# Patient Record
Sex: Female | Born: 2000 | Hispanic: No | Marital: Single | Smoking: Never smoker
Health system: Southern US, Community
[De-identification: ages and names within clinical notes are randomized; demographics above are authoritative.]

## PROBLEM LIST (undated history)

## (undated) DIAGNOSIS — J189 Pneumonia, unspecified organism: Secondary | ICD-10-CM

## (undated) DIAGNOSIS — J302 Other seasonal allergic rhinitis: Secondary | ICD-10-CM

## (undated) HISTORY — DX: Pneumonia, unspecified organism: J18.9

---
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
## 2021-01-07 DIAGNOSIS — F4320 Adjustment disorder, unspecified: Secondary | ICD-10-CM | POA: Diagnosis not present

## 2021-01-10 DIAGNOSIS — M25511 Pain in right shoulder: Secondary | ICD-10-CM | POA: Diagnosis not present

## 2021-01-17 DIAGNOSIS — S43401A Unspecified sprain of right shoulder joint, initial encounter: Secondary | ICD-10-CM | POA: Diagnosis not present

## 2021-01-18 DIAGNOSIS — M25511 Pain in right shoulder: Secondary | ICD-10-CM | POA: Diagnosis not present

## 2021-01-21 DIAGNOSIS — F4320 Adjustment disorder, unspecified: Secondary | ICD-10-CM | POA: Diagnosis not present

## 2021-01-24 DIAGNOSIS — M25511 Pain in right shoulder: Secondary | ICD-10-CM | POA: Diagnosis not present

## 2021-01-28 IMAGING — DX DG FOOT COMPLETE 3+V*R*
3 series · 3 of 3 positions shown · non-contrast
Comparison: None.

CLINICAL DATA: Dorsal foot pain

EXAM:
RIGHT FOOT COMPLETE - 3+ VIEW

[foot ap]
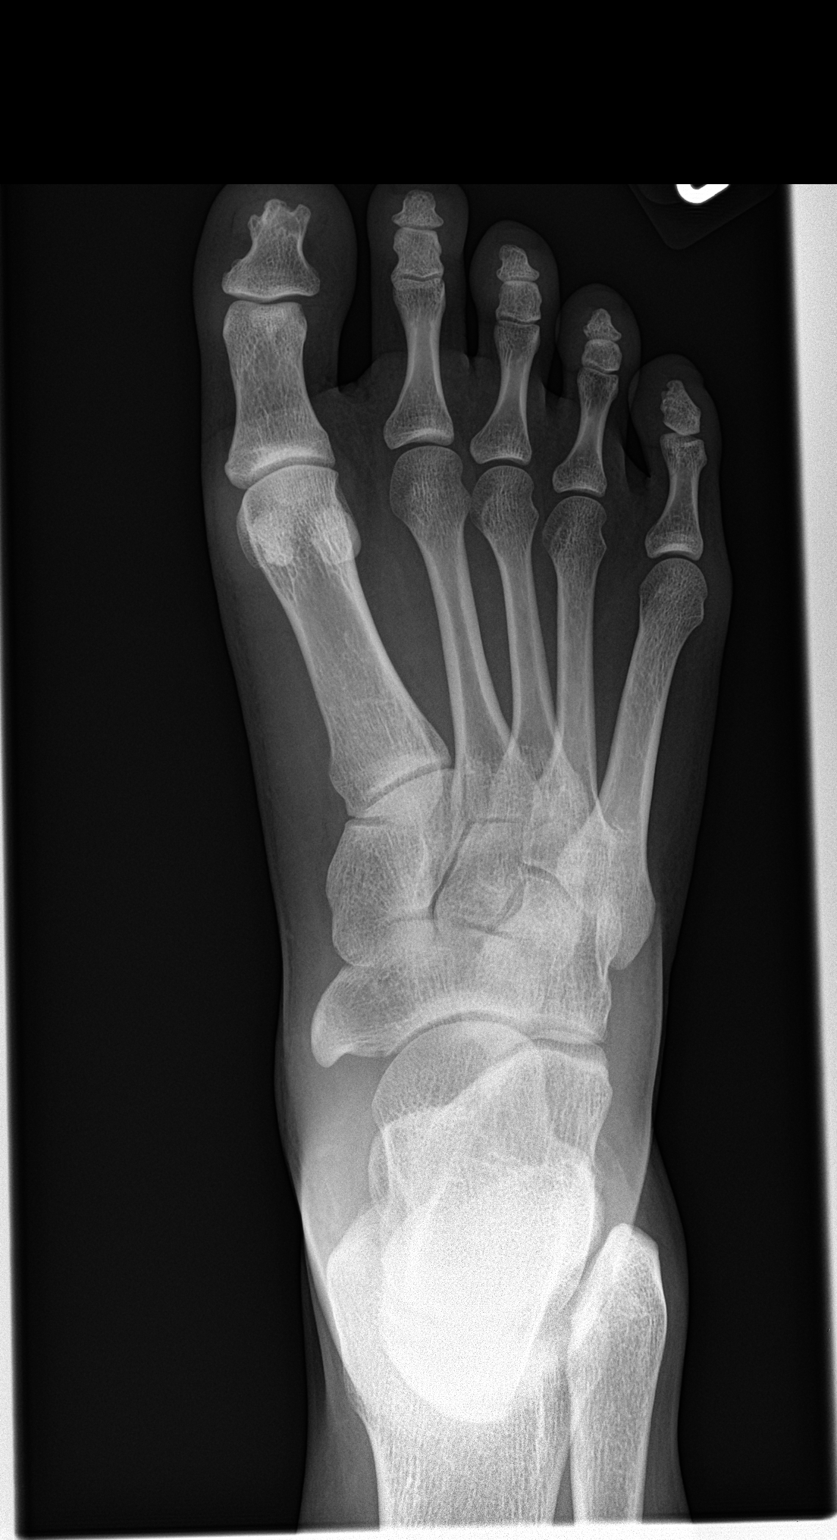

[foot obl]
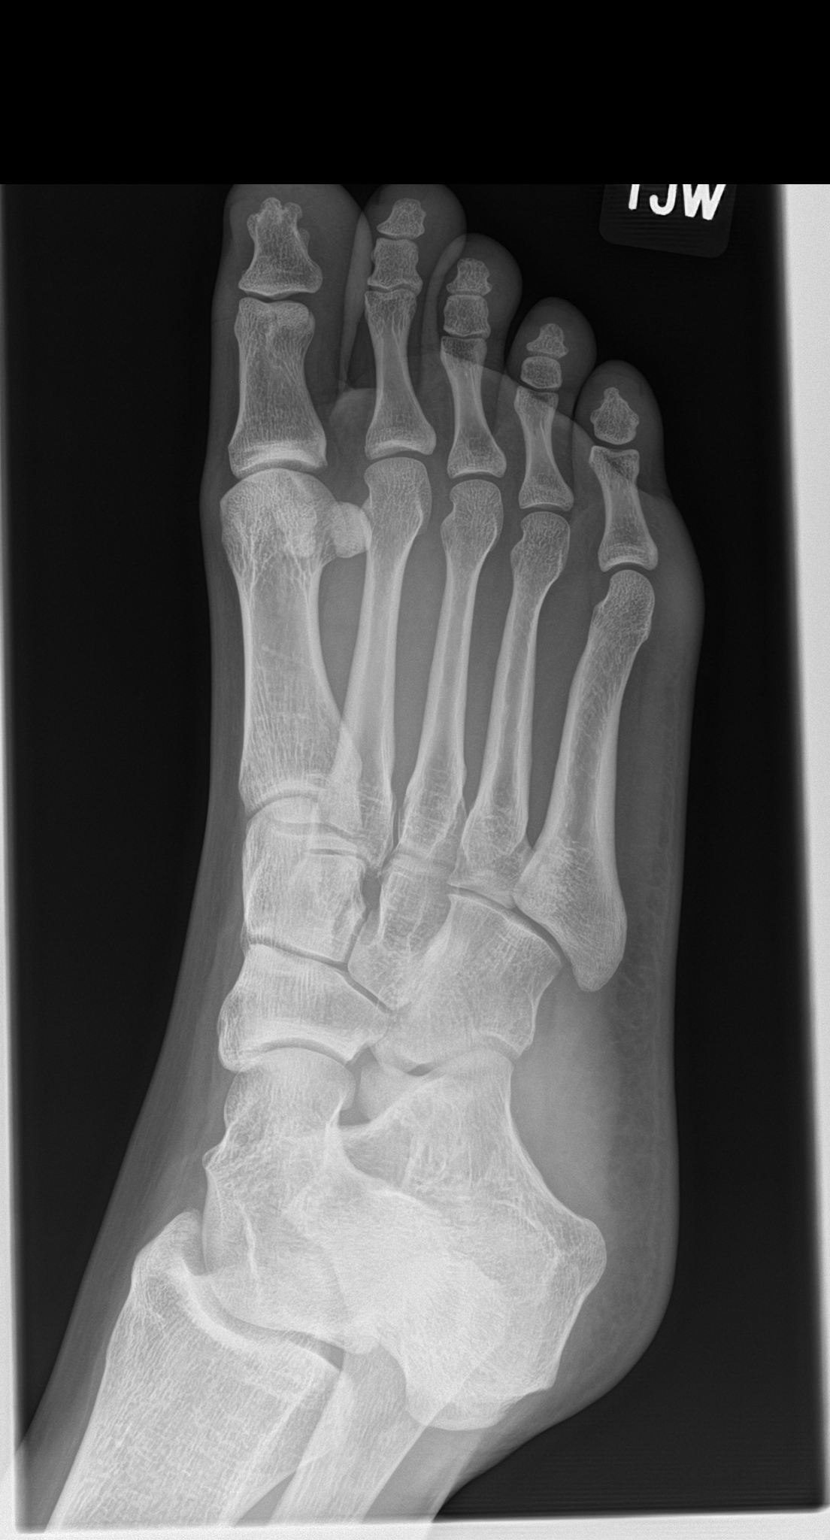

[foot lat]
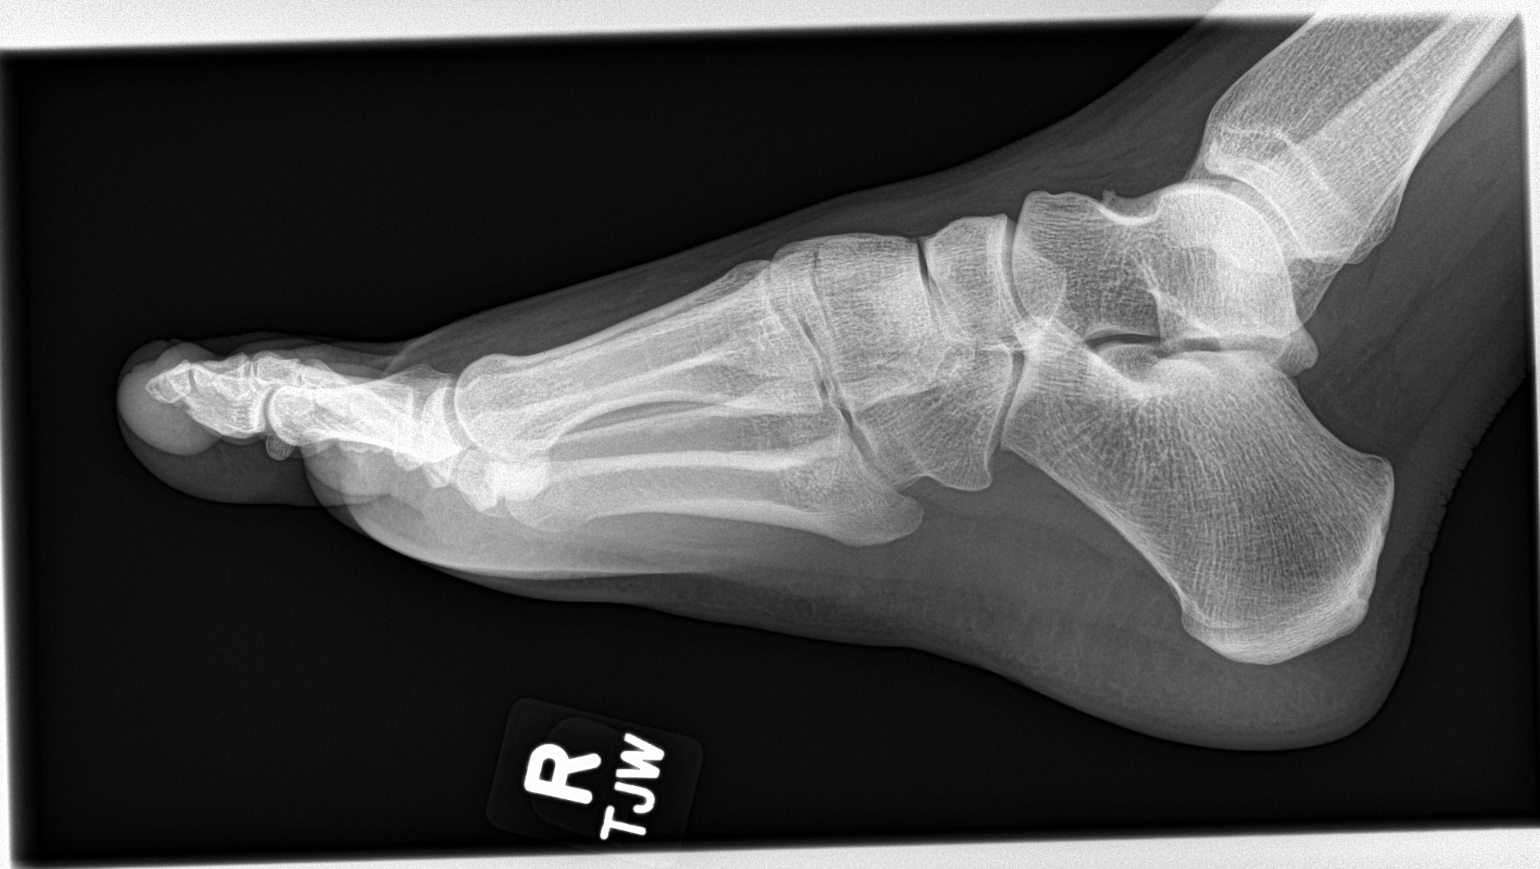

[3 of 3 positions shown; findings below may reference images not displayed]

FINDINGS: Alignment is anatomic. There is no acute fracture. Joint spaces are
preserved. No intrinsic osseous lesion.
IMPRESSION: Negative.

## 2021-02-04 DIAGNOSIS — F4320 Adjustment disorder, unspecified: Secondary | ICD-10-CM | POA: Diagnosis not present

## 2021-02-07 DIAGNOSIS — M67911 Unspecified disorder of synovium and tendon, right shoulder: Secondary | ICD-10-CM | POA: Diagnosis not present

## 2021-02-07 DIAGNOSIS — S43401A Unspecified sprain of right shoulder joint, initial encounter: Secondary | ICD-10-CM | POA: Diagnosis not present

## 2021-02-18 DIAGNOSIS — F4320 Adjustment disorder, unspecified: Secondary | ICD-10-CM | POA: Diagnosis not present

## 2021-02-22 ENCOUNTER — Encounter: Payer: Self-pay | Admitting: Primary Care

## 2021-02-22 ENCOUNTER — Ambulatory Visit (INDEPENDENT_AMBULATORY_CARE_PROVIDER_SITE_OTHER): Payer: BC Managed Care – PPO | Admitting: Primary Care

## 2021-02-22 ENCOUNTER — Other Ambulatory Visit: Payer: Self-pay

## 2021-02-22 ENCOUNTER — Other Ambulatory Visit: Payer: Self-pay | Admitting: Primary Care

## 2021-02-22 ENCOUNTER — Ambulatory Visit: Payer: BC Managed Care – PPO | Admitting: Family

## 2021-02-22 DIAGNOSIS — R000 Tachycardia, unspecified: Secondary | ICD-10-CM | POA: Diagnosis not present

## 2021-02-22 DIAGNOSIS — J309 Allergic rhinitis, unspecified: Secondary | ICD-10-CM | POA: Diagnosis not present

## 2021-02-22 DIAGNOSIS — R109 Unspecified abdominal pain: Secondary | ICD-10-CM | POA: Diagnosis not present

## 2021-02-22 LAB — CBC
HCT: 40.2 % (ref 36.0–49.0)
Hemoglobin: 13 g/dL (ref 12.0–16.0)
MCHC: 32.4 g/dL (ref 31.0–37.0)
MCV: 89.6 fl (ref 78.0–98.0)
Platelets: 168 10*3/uL (ref 150.0–575.0)
RBC: 4.48 Mil/uL (ref 3.80–5.70)
RDW: 12.9 % (ref 11.4–15.5)
WBC: 3.5 10*3/uL — ABNORMAL LOW (ref 4.5–13.5)

## 2021-02-22 LAB — BASIC METABOLIC PANEL
BUN: 9 mg/dL (ref 6–23)
CO2: 29 mEq/L (ref 19–32)
Calcium: 9.4 mg/dL (ref 8.4–10.5)
Chloride: 102 mEq/L (ref 96–112)
Creatinine, Ser: 0.76 mg/dL (ref 0.40–1.20)
GFR: 113.38 mL/min (ref >60.00)
Glucose, Bld: 84 mg/dL (ref 70–99)
Potassium: 4.6 mEq/L (ref 3.5–5.1)
Sodium: 137 mEq/L (ref 135–145)

## 2021-02-22 LAB — TSH: TSH: 1.21 u[IU]/mL (ref 0.40–5.00)

## 2021-02-22 NOTE — Assessment & Plan Note (Signed)
Improving.  No other associated symptoms. Likely muscular, but will pursue further work-up if symptoms do not completely abate and/or progress.  She agrees.

## 2021-02-22 NOTE — Progress Notes (Signed)
Subjective:    Patient ID: Jessica Roth, female    DOB: May 20, 2000, 21 y.o.   MRN: [id number]  HPI  Jessica Roth is a very pleasant 21 y.o. female with history of knee pain, hand swelling, chronic neck pain, foot pain who presents today to discuss several symptoms.  1) Abdominal Pain: Located to the right lower abdomen and right lower flank pain which four days ago, occurred suddenly at work while standing. She describes her pain as sharp on the day of her symptoms, since then her symptoms are more achy and dull. Overall symptoms have improved.   Symptoms are worse with certain movement. She denies nausea, vomiting, constipation, dysuria, dysmenorrhea. She is eating a drinking per her normal routine.   2) Nasal Congestion: Symptoms also include hoarse voice, nasal congestion, mild dry cough. History of allergies. She resumed her Xyzal 5 mg daily. She purchased Flonase yesterday.   3) Tachycardia: Acute and intermittent over the last month with rest, occurs several times weekly, readings mostly in the 120's when the episode occurs, lasts 1-2 minutes.   Yesterday she was sitting at the desk at work when she felt her heart beating fast, so she checked her smart watch and her heart rate was in the 150s.  This lasted for about 2 minutes and resolved after some deep breathing exercises.    She denies dizziness, chest pain, feeling anxious, menorrhagia. She is a non smoker. She drinks little caffeine. She is not on birth control pills. She sleeps 3-4 hours nightly on average as she is working 2 part time jobs with extended hours.  She drinks 60 to 90 ounces of water daily.  Review of Systems  HENT:  Positive for congestion and postnasal drip.   Respiratory:  Negative for shortness of breath.   Cardiovascular:  Positive for palpitations. Negative for chest pain.  Gastrointestinal:  Positive for abdominal pain. Negative for constipation, diarrhea, nausea and vomiting.  Genitourinary:  Negative for  dysuria and frequency.  Allergic/Immunologic: Positive for environmental allergies.  Neurological:  Negative for dizziness.  Psychiatric/Behavioral:  The patient is not nervous/anxious.         Past Medical History:  Diagnosis Date   Pneumonia    Seasonal allergies     Social History   Socioeconomic History   Marital status: Single    Spouse name: Not on file   Number of children: Not on file   Years of education: Not on file   Highest education level: Not on file  Occupational History   Not on file  Tobacco Use   Smoking status: Never   Smokeless tobacco: Never  Vaping Use   Vaping Use: Never used  Substance and Sexual Activity   Alcohol use: No    Alcohol/week: 0.0 standard drinks   Drug use: Never   Sexual activity: Not on file  Other Topics Concern   Not on file  Social History Narrative   Going into 8th grade.   Enjoys playing soccer and plays on a team.   Favorite food is pasta, pizza, fruit.   Favorite color is black.   2 sisters and 1 brother   Social Determinants of Corporate investment banker Strain: Not on file  Food Insecurity: Not on file  Transportation Needs: Not on file  Physical Activity: Not on file  Stress: Not on file  Social Connections: Not on file  Intimate Partner Violence: Not on file    History reviewed. No pertinent surgical history.  Family  History  Adopted: Yes    No Known Allergies  Current Outpatient Medications on File Prior to Visit  Medication Sig Dispense Refill   levocetirizine (XYZAL) 5 MG tablet Take 5 mg by mouth every evening.     No current facility-administered medications on file prior to visit.    BP 108/60    Pulse 82    Temp 99 F (37.2 C) (Oral)    Ht 5\' 4"  (1.626 m)    Wt 146 lb (66.2 kg)    SpO2 98%    BMI 25.06 kg/m  Objective:   Physical Exam Cardiovascular:     Rate and Rhythm: Normal rate and regular rhythm.  Pulmonary:     Effort: Pulmonary effort is normal.     Breath sounds: Normal  breath sounds.  Musculoskeletal:     Cervical back: Neck supple.  Skin:    General: Skin is warm and dry.  Neurological:     Mental Status: She is alert.  Psychiatric:        Mood and Affect: Mood normal.          Assessment & Plan:      This visit occurred during the SARS-CoV-2 public health emergency.  Safety protocols were in place, including screening questions prior to the visit, additional usage of staff PPE, and extensive cleaning of exam room while observing appropriate contact time as indicated for disinfecting solutions.

## 2021-02-22 NOTE — Patient Instructions (Addendum)
Stop by the lab prior to leaving today. I will notify you of your results once received.   Notify me if the pain in your abdomen does not resolve and/or gets worse.  You need to increase your hours of sleep every night to at least 8 hours.   It was a pleasure to see you today!

## 2021-02-22 NOTE — Assessment & Plan Note (Signed)
Obvious on exam and through HPI today.  Continue Xyzal 5 mg daily. Start Flonase twice daily.  She does not appear acutely ill.

## 2021-02-22 NOTE — Assessment & Plan Note (Signed)
Checked by patient on smart watch.  Heart rate today within normal range, also during prior visits.  Unclear etiology, but will rule out metabolic cause. Labs pending today for TSH, CBC, BMP.  We discussed that she needs to work on increasing sleeping hours.  We also discussed proper hydration with water.  If labs negative, and symptoms persist, then will send to cardiology for evaluation with Holter monitor.

## 2021-03-02 DIAGNOSIS — M545 Low back pain, unspecified: Secondary | ICD-10-CM | POA: Diagnosis not present

## 2021-03-02 DIAGNOSIS — M542 Cervicalgia: Secondary | ICD-10-CM | POA: Diagnosis not present

## 2021-03-04 DIAGNOSIS — F4320 Adjustment disorder, unspecified: Secondary | ICD-10-CM | POA: Diagnosis not present

## 2021-03-10 DIAGNOSIS — F4320 Adjustment disorder, unspecified: Secondary | ICD-10-CM | POA: Diagnosis not present

## 2021-03-18 DIAGNOSIS — F4320 Adjustment disorder, unspecified: Secondary | ICD-10-CM | POA: Diagnosis not present

## 2021-04-01 DIAGNOSIS — F4320 Adjustment disorder, unspecified: Secondary | ICD-10-CM | POA: Diagnosis not present

## 2021-04-15 DIAGNOSIS — F4320 Adjustment disorder, unspecified: Secondary | ICD-10-CM | POA: Diagnosis not present

## 2021-04-20 DIAGNOSIS — F4320 Adjustment disorder, unspecified: Secondary | ICD-10-CM | POA: Diagnosis not present

## 2021-04-20 DIAGNOSIS — M542 Cervicalgia: Secondary | ICD-10-CM | POA: Diagnosis not present

## 2021-04-20 DIAGNOSIS — M545 Low back pain, unspecified: Secondary | ICD-10-CM | POA: Diagnosis not present

## 2021-04-29 DIAGNOSIS — F4320 Adjustment disorder, unspecified: Secondary | ICD-10-CM | POA: Diagnosis not present

## 2021-05-05 DIAGNOSIS — F4320 Adjustment disorder, unspecified: Secondary | ICD-10-CM | POA: Diagnosis not present

## 2021-05-13 DIAGNOSIS — F4320 Adjustment disorder, unspecified: Secondary | ICD-10-CM | POA: Diagnosis not present

## 2021-05-18 DIAGNOSIS — F4320 Adjustment disorder, unspecified: Secondary | ICD-10-CM | POA: Diagnosis not present

## 2021-05-27 DIAGNOSIS — F4320 Adjustment disorder, unspecified: Secondary | ICD-10-CM | POA: Diagnosis not present

## 2021-05-31 DIAGNOSIS — F4320 Adjustment disorder, unspecified: Secondary | ICD-10-CM | POA: Diagnosis not present

## 2021-06-08 DIAGNOSIS — F4320 Adjustment disorder, unspecified: Secondary | ICD-10-CM | POA: Diagnosis not present

## 2021-06-15 DIAGNOSIS — M25551 Pain in right hip: Secondary | ICD-10-CM | POA: Diagnosis not present

## 2021-06-15 DIAGNOSIS — M545 Low back pain, unspecified: Secondary | ICD-10-CM | POA: Diagnosis not present

## 2021-06-15 DIAGNOSIS — M542 Cervicalgia: Secondary | ICD-10-CM | POA: Diagnosis not present

## 2021-06-16 DIAGNOSIS — F4320 Adjustment disorder, unspecified: Secondary | ICD-10-CM | POA: Diagnosis not present

## 2021-06-17 ENCOUNTER — Encounter: Payer: BC Managed Care – PPO | Admitting: Primary Care

## 2021-06-23 DIAGNOSIS — M25551 Pain in right hip: Secondary | ICD-10-CM | POA: Diagnosis not present

## 2021-06-23 DIAGNOSIS — F4320 Adjustment disorder, unspecified: Secondary | ICD-10-CM | POA: Diagnosis not present

## 2021-06-23 DIAGNOSIS — M545 Low back pain, unspecified: Secondary | ICD-10-CM | POA: Diagnosis not present

## 2021-06-23 DIAGNOSIS — M542 Cervicalgia: Secondary | ICD-10-CM | POA: Diagnosis not present

## 2021-06-30 DIAGNOSIS — F4320 Adjustment disorder, unspecified: Secondary | ICD-10-CM | POA: Diagnosis not present

## 2021-07-07 DIAGNOSIS — M545 Low back pain, unspecified: Secondary | ICD-10-CM | POA: Diagnosis not present

## 2021-07-07 DIAGNOSIS — F4320 Adjustment disorder, unspecified: Secondary | ICD-10-CM | POA: Diagnosis not present

## 2021-07-07 DIAGNOSIS — M542 Cervicalgia: Secondary | ICD-10-CM | POA: Diagnosis not present

## 2021-07-07 DIAGNOSIS — M25551 Pain in right hip: Secondary | ICD-10-CM | POA: Diagnosis not present

## 2021-07-21 DIAGNOSIS — F4320 Adjustment disorder, unspecified: Secondary | ICD-10-CM | POA: Diagnosis not present

## 2021-07-21 DIAGNOSIS — M25551 Pain in right hip: Secondary | ICD-10-CM | POA: Diagnosis not present

## 2021-07-21 DIAGNOSIS — M542 Cervicalgia: Secondary | ICD-10-CM | POA: Diagnosis not present

## 2021-07-21 DIAGNOSIS — M545 Low back pain, unspecified: Secondary | ICD-10-CM | POA: Diagnosis not present

## 2021-08-01 DIAGNOSIS — F4320 Adjustment disorder, unspecified: Secondary | ICD-10-CM | POA: Diagnosis not present

## 2021-08-09 DIAGNOSIS — F4320 Adjustment disorder, unspecified: Secondary | ICD-10-CM | POA: Diagnosis not present

## 2021-08-11 DIAGNOSIS — M542 Cervicalgia: Secondary | ICD-10-CM | POA: Diagnosis not present

## 2021-08-11 DIAGNOSIS — M25551 Pain in right hip: Secondary | ICD-10-CM | POA: Diagnosis not present

## 2021-08-11 DIAGNOSIS — M545 Low back pain, unspecified: Secondary | ICD-10-CM | POA: Diagnosis not present

## 2021-08-18 ENCOUNTER — Ambulatory Visit (INDEPENDENT_AMBULATORY_CARE_PROVIDER_SITE_OTHER)
Admission: RE | Admit: 2021-08-18 | Discharge: 2021-08-18 | Disposition: A | Discharge status: Home / Self Care | Payer: BC Managed Care – PPO | Source: Ambulatory Visit | Attending: Primary Care | Admitting: Primary Care

## 2021-08-18 ENCOUNTER — Encounter: Payer: Self-pay | Admitting: Primary Care

## 2021-08-18 ENCOUNTER — Ambulatory Visit: Payer: BC Managed Care – PPO | Admitting: Primary Care

## 2021-08-18 VITALS — BP 110/82 | HR 88 | Temp 98.6°F | Ht 64.0 in | Wt 149.0 lb

## 2021-08-18 DIAGNOSIS — M79672 Pain in left foot: Secondary | ICD-10-CM | POA: Diagnosis not present

## 2021-08-18 DIAGNOSIS — F4320 Adjustment disorder, unspecified: Secondary | ICD-10-CM | POA: Diagnosis not present

## 2021-08-18 HISTORY — DX: Pain in left foot: M79.672

## 2021-08-18 NOTE — Patient Instructions (Signed)
Complete xray(s) prior to leaving today. I will notify you of your results once received.  Continue to tape your toes for stability for now.  Ensure you are wearing comfortable shoes.   It was a pleasure to see you today!

## 2021-08-18 NOTE — Assessment & Plan Note (Signed)
Abnormal exam, no obvious fracture, but in light of trauma will obtain xray. Plain films of the left foot pending.  Continue to buddy tape.  Conservative instructions provided.

## 2021-08-18 NOTE — Progress Notes (Signed)
Subjective:    Patient ID: Jessica Roth, female    DOB: [birth year], 21 y.o.   MRN: [id number]  Foot Injury  Pertinent negatives include no numbness.    Jessica Roth is a very pleasant 21 y.o. female with a history of left knee pain, chronic neck pain, right foot pain who presents today   Her pain is located to the left dorsal 2nd and 3rd toes that began 2 weeks ago. Initially, she developed bruising and swelling. She was at work, a raised up swinging door hit the dorsal left 1st-3rd toes. She's been working out at [employer] and trying to run on the treadmill, has noticed pain to the toes when running. She continues to run at the gyn. She's tapped up her 2nd and 3rd toes together for stability. Overall she's about the same. She's able to carry on at work.    Review of Systems  Musculoskeletal:  Positive for arthralgias.  Skin:  Negative for color change.  Neurological:  Negative for weakness and numbness.         Past Medical History:  Diagnosis Date   Pneumonia    Seasonal allergies     Social History   Socioeconomic History   Marital status: Single    Spouse name: Not on file   Number of children: Not on file   Years of education: Not on file   Highest education level: Not on file  Occupational History   Not on file  Tobacco Use   Smoking status: Never   Smokeless tobacco: Never  Vaping Use   Vaping Use: Never used  Substance and Sexual Activity   Alcohol use: No    Alcohol/week: 0.0 standard drinks of alcohol   Drug use: Never   Sexual activity: Not on file  Other Topics Concern   Not on file  Social History Narrative   Going into 8th grade.   Enjoys playing soccer and plays on a team.   Favorite food is pasta, pizza, fruit.   Favorite color is black.   2 sisters and 1 brother   Social Determinants of Corporate investment banker Strain: Not on file  Food Insecurity: Not on file  Transportation Needs: Not on file  Physical Activity: Not on file  Stress:  Not on file  Social Connections: Not on file  Intimate Partner Violence: Not on file    History reviewed. No pertinent surgical history.  Family History  Adopted: Yes    No Known Allergies  Current Outpatient Medications on File Prior to Visit  Medication Sig Dispense Refill   levocetirizine (XYZAL) 5 MG tablet Take 5 mg by mouth every evening.     No current facility-administered medications on file prior to visit.    BP 110/82   Pulse 88   Temp 98.6 F (37 C) (Oral)   Ht 5\' 4"  (1.626 m)   Wt 149 lb (67.6 kg)   LMP 08/17/2021 (Exact Date)   SpO2 98%   BMI 25.58 kg/m  Objective:   Physical Exam Musculoskeletal:     Left foot: Decreased range of motion. Tenderness present. No swelling or deformity.     Comments: Decrease in ROM with pain to 2nd and 3rd DIP joint of left toes. No erythema or swelling. No metatarsal bone pain.   Skin:    General: Skin is warm and dry.     Findings: No erythema.           Assessment & Plan:  Problem List Items Addressed This Visit       Other   Acute foot pain, left - Primary    Abnormal exam, no obvious fracture, but in light of trauma will obtain xray. Plain films of the left foot pending.  Continue to buddy tape.  Conservative instructions provided.       Relevant Orders   DG Foot Complete Left       Doreene Nest, NP

## 2021-08-23 DIAGNOSIS — F4320 Adjustment disorder, unspecified: Secondary | ICD-10-CM | POA: Diagnosis not present

## 2021-09-01 DIAGNOSIS — M542 Cervicalgia: Secondary | ICD-10-CM | POA: Diagnosis not present

## 2021-09-01 DIAGNOSIS — M25551 Pain in right hip: Secondary | ICD-10-CM | POA: Diagnosis not present

## 2021-09-01 DIAGNOSIS — M545 Low back pain, unspecified: Secondary | ICD-10-CM | POA: Diagnosis not present

## 2021-09-06 DIAGNOSIS — F4320 Adjustment disorder, unspecified: Secondary | ICD-10-CM | POA: Diagnosis not present

## 2021-09-13 DIAGNOSIS — F4320 Adjustment disorder, unspecified: Secondary | ICD-10-CM | POA: Diagnosis not present

## 2021-09-27 DIAGNOSIS — M542 Cervicalgia: Secondary | ICD-10-CM | POA: Diagnosis not present

## 2021-09-27 DIAGNOSIS — M545 Low back pain, unspecified: Secondary | ICD-10-CM | POA: Diagnosis not present

## 2021-09-27 DIAGNOSIS — M25551 Pain in right hip: Secondary | ICD-10-CM | POA: Diagnosis not present

## 2021-09-27 DIAGNOSIS — F4320 Adjustment disorder, unspecified: Secondary | ICD-10-CM | POA: Diagnosis not present

## 2021-10-04 DIAGNOSIS — F4320 Adjustment disorder, unspecified: Secondary | ICD-10-CM | POA: Diagnosis not present

## 2021-10-11 DIAGNOSIS — F4320 Adjustment disorder, unspecified: Secondary | ICD-10-CM | POA: Diagnosis not present

## 2021-10-18 DIAGNOSIS — F4320 Adjustment disorder, unspecified: Secondary | ICD-10-CM | POA: Diagnosis not present

## 2021-10-19 ENCOUNTER — Encounter: Payer: Self-pay | Admitting: Primary Care

## 2021-10-19 ENCOUNTER — Encounter: Payer: BC Managed Care – PPO | Admitting: Primary Care

## 2021-10-19 ENCOUNTER — Ambulatory Visit (INDEPENDENT_AMBULATORY_CARE_PROVIDER_SITE_OTHER): Payer: BC Managed Care – PPO | Admitting: Primary Care

## 2021-10-19 VITALS — BP 118/78 | HR 87 | Temp 97.3°F | Ht 64.0 in | Wt 150.0 lb

## 2021-10-19 DIAGNOSIS — Z0000 Encounter for general adult medical examination without abnormal findings: Secondary | ICD-10-CM | POA: Diagnosis not present

## 2021-10-19 DIAGNOSIS — Z1159 Encounter for screening for other viral diseases: Secondary | ICD-10-CM

## 2021-10-19 DIAGNOSIS — Z114 Encounter for screening for human immunodeficiency virus [HIV]: Secondary | ICD-10-CM

## 2021-10-19 DIAGNOSIS — J309 Allergic rhinitis, unspecified: Secondary | ICD-10-CM | POA: Diagnosis not present

## 2021-10-19 DIAGNOSIS — R7989 Other specified abnormal findings of blood chemistry: Secondary | ICD-10-CM | POA: Insufficient documentation

## 2021-10-19 LAB — CBC WITH DIFFERENTIAL/PLATELET
Basophils Absolute: 0 10*3/uL (ref 0.0–0.1)
Basophils Relative: 0.7 % (ref 0.0–3.0)
Eosinophils Absolute: 0.1 10*3/uL (ref 0.0–0.7)
Eosinophils Relative: 1.5 % (ref 0.0–5.0)
HCT: 39.3 % (ref 36.0–46.0)
Hemoglobin: 13 g/dL (ref 12.0–15.0)
Lymphocytes Relative: 41 % (ref 12.0–46.0)
Lymphs Abs: 1.9 10*3/uL (ref 0.7–4.0)
MCHC: 33 g/dL (ref 30.0–36.0)
MCV: 88.6 fl (ref 78.0–100.0)
Monocytes Absolute: 0.4 10*3/uL (ref 0.1–1.0)
Monocytes Relative: 8.6 % (ref 3.0–12.0)
Neutro Abs: 2.3 10*3/uL (ref 1.4–7.7)
Neutrophils Relative %: 48.2 % (ref 43.0–77.0)
Platelets: 196 10*3/uL (ref 150.0–400.0)
RBC: 4.43 Mil/uL (ref 3.87–5.11)
RDW: 13.4 % (ref 11.5–14.6)
WBC: 4.7 10*3/uL (ref 4.5–10.5)

## 2021-10-19 NOTE — Progress Notes (Signed)
Subjective:    Patient ID: Jessica Roth, female    DOB: 07-14-2000, 21 y.o.   MRN: [id number]  HPI  Jessica Roth is a very pleasant 21 y.o. female who presents today for complete physical and follow up of chronic conditions.  Immunizations: -Tetanus: 2020 -Influenza: Declines   -HPV: Has not completed, declines   Diet: Fair diet.  Exercise: Regular exercise at the gym 3-5 times weekly.  Eye exam: Completed this year  Dental exam: Completes annually   BP Readings from Last 3 Encounters:  10/19/21 118/78  08/18/21 110/82  02/22/21 108/60      Review of Systems  Constitutional:  Negative for unexpected weight change.  HENT:  Negative for rhinorrhea.   Respiratory:  Negative for cough and shortness of breath.   Cardiovascular:  Negative for chest pain.  Gastrointestinal:  Negative for constipation and diarrhea.  Genitourinary:  Negative for difficulty urinating and menstrual problem.  Musculoskeletal:  Negative for arthralgias and myalgias.  Skin:  Negative for rash.  Allergic/Immunologic: Positive for environmental allergies.  Neurological:  Negative for dizziness and headaches.  Psychiatric/Behavioral:  The patient is nervous/anxious.        Overall deals well with anxiety.          Past Medical History:  Diagnosis Date   Acute foot pain, left 08/18/2021   Acute pain of right foot 06/24/2019   Decrease in appetite 04/15/2020   Knee pain, left 06/17/2014   Pneumonia    Seasonal allergies     Social History   Socioeconomic History   Marital status: Single    Spouse name: Not on file   Number of children: Not on file   Years of education: Not on file   Highest education level: Not on file  Occupational History   Not on file  Tobacco Use   Smoking status: Never   Smokeless tobacco: Never  Vaping Use   Vaping Use: Never used  Substance and Sexual Activity   Alcohol use: No    Alcohol/week: 0.0 standard drinks of alcohol   Drug use: Never   Sexual  activity: Not on file  Other Topics Concern   Not on file  Social History Narrative   Going into 8th grade.   Enjoys playing soccer and plays on a team.   Favorite food is pasta, pizza, fruit.   Favorite color is black.   2 sisters and 1 brother   Social Determinants of Corporate investment banker Strain: Not on file  Food Insecurity: Not on file  Transportation Needs: Not on file  Physical Activity: Not on file  Stress: Not on file  Social Connections: Not on file  Intimate Partner Violence: Not on file    History reviewed. No pertinent surgical history.  Family History  Adopted: Yes    No Known Allergies  Current Outpatient Medications on File Prior to Visit  Medication Sig Dispense Refill   levocetirizine (XYZAL) 5 MG tablet Take 5 mg by mouth every evening.     No current facility-administered medications on file prior to visit.    BP 118/78   Pulse 87   Temp (!) 97.3 F (36.3 C) (Temporal)   Ht 5\' 4"  (1.626 m)   Wt 150 lb (68 kg)   LMP 09/25/2021 (Approximate)   SpO2 96%   BMI 25.75 kg/m  Objective:   Physical Exam HENT:     Right Ear: Tympanic membrane and ear canal normal.     Left Ear: Tympanic  membrane and ear canal normal.     Nose: Nose normal.  Eyes:     Conjunctiva/sclera: Conjunctivae normal.     Pupils: Pupils are equal, round, and reactive to light.  Neck:     Thyroid: No thyromegaly.  Cardiovascular:     Rate and Rhythm: Normal rate and regular rhythm.     Heart sounds: No murmur heard. Pulmonary:     Effort: Pulmonary effort is normal.     Breath sounds: Normal breath sounds. No rales.  Abdominal:     General: Bowel sounds are normal.     Palpations: Abdomen is soft.     Tenderness: There is no abdominal tenderness.  Musculoskeletal:        General: Normal range of motion.     Cervical back: Neck supple.  Lymphadenopathy:     Cervical: No cervical adenopathy.  Skin:    General: Skin is warm and dry.     Findings: No rash.   Neurological:     Mental Status: She is alert and oriented to person, place, and time.     Cranial Nerves: No cranial nerve deficit.     Deep Tendon Reflexes: Reflexes are normal and symmetric.  Psychiatric:        Mood and Affect: Mood normal.           Assessment & Plan:   Problem List Items Addressed This Visit       Respiratory   Allergic rhinitis    Overall stable.  Continue Xyzal 5 mg PRN.         Other   Abnormal CBC    Mild reduction in WBC count earlier this year. Likely benign.  Repeat CBC pending. Add differential. Consider pathology smear if warranted.  No alarm signs.      Relevant Orders   CBC with Differential/Platelet   Preventative health care - Primary    Tetanus UTD, declines influenza and HPV vaccines. Pap smear due next year.  Commended her on regular exercise and a fair diet. Exam today unremarkable. Labs pending.        Other Visit Diagnoses     Screening for HIV (human immunodeficiency virus)       Relevant Orders   HIV Antibody (routine testing w rflx)   Encounter for hepatitis C screening test for low risk patient       Relevant Orders   Hepatitis C Antibody          Pleas Koch, NP

## 2021-10-19 NOTE — Assessment & Plan Note (Signed)
Overall stable.  Continue Xyzal 5 mg PRN.

## 2021-10-19 NOTE — Patient Instructions (Addendum)
Stop by the lab prior to leaving today. I will notify you of your results once received.   It was a pleasure to see you today!  Preventive Care 20-20 Years Old, Female Preventive care refers to lifestyle choices and visits with your health care provider that can promote health and wellness. At this stage in your life, you may start seeing a primary care physician instead of a pediatrician for your preventive care. Preventive care visits are also called wellness exams. What can I expect for my preventive care visit? Counseling During your preventive care visit, your health care provider may ask about your: Medical history, including: Past medical problems. Family medical history. Pregnancy history. Current health, including: Menstrual cycle. Method of birth control. Emotional well-being. Home life and relationship well-being. Sexual activity and sexual health. Lifestyle, including: Alcohol, nicotine or tobacco, and drug use. Access to firearms. Diet, exercise, and sleep habits. Sunscreen use. Motor vehicle safety. Physical exam Your health care provider may check your: Height and weight. These may be used to calculate your BMI (body mass index). BMI is a measurement that tells if you are at a healthy weight. Waist circumference. This measures the distance around your waistline. This measurement also tells if you are at a healthy weight and may help predict your risk of certain diseases, such as type 2 diabetes and high blood pressure. Heart rate and blood pressure. Body temperature. Skin for abnormal spots. Breasts. What immunizations do I need?  Vaccines are usually given at various ages, according to a schedule. Your health care provider will recommend vaccines for you based on your age, medical history, and lifestyle or other factors, such as travel or where you work. What tests do I need? Screening Your health care provider may recommend screening tests for certain  conditions. This may include: Vision and hearing tests. Lipid and cholesterol levels. Pelvic exam and Pap test. Hepatitis B test. Hepatitis C test. HIV (human immunodeficiency virus) test. STI (sexually transmitted infection) testing, if you are at risk. Tuberculosis skin test if you have symptoms. BRCA-related cancer screening. This may be done if you have a family history of breast, ovarian, tubal, or peritoneal cancers. Talk with your health care provider about your test results, treatment options, and if necessary, the need for more tests. Follow these instructions at home: Eating and drinking  Eat a healthy diet that includes fresh fruits and vegetables, whole grains, lean protein, and low-fat dairy products. Drink enough fluid to keep your urine pale yellow. Do not drink alcohol if: Your health care provider tells you not to drink. You are pregnant, may be pregnant, or are planning to become pregnant. You are under the legal drinking age. In the [place] the legal drinking age is 47. If you drink alcohol: Limit how much you have to 0-1 drink a day. Know how much alcohol is in your drink. In the [place] one drink equals one 12 oz bottle of beer (355 mL), one 5 oz glass of wine (148 mL), or one 1 oz glass of hard liquor (44 mL). Lifestyle Brush your teeth every morning and night with fluoride toothpaste. Floss one time each day. Exercise for at least 30 minutes 5 or more days of the week. Do not use any products that contain nicotine or tobacco. These products include cigarettes, chewing tobacco, and vaping devices, such as e-cigarettes. If you need help quitting, ask your health care provider. Do not use drugs. If you are sexually active, practice safe sex. Use a condom  or other form of protection to prevent STIs. If you do not wish to become pregnant, use a form of birth control. If you plan to become pregnant, see your health care provider for a prepregnancy visit. Find healthy ways  to manage stress, such as: Meditation, yoga, or listening to music. Journaling. Talking to a trusted person. Spending time with friends and family. Safety Always wear your seat belt while driving or riding in a vehicle. Do not drive: If you have been drinking alcohol. Do not ride with someone who has been drinking. When you are tired or distracted. While texting. If you have been using any mind-altering substances or drugs. Wear a helmet and other protective equipment during sports activities. If you have firearms in your house, make sure you follow all gun safety procedures. Seek help if you have been bullied, physically abused, or sexually abused. Use the internet responsibly to avoid dangers, such as online bullying and online sex predators. What's next? Go to your health care provider once a year for an annual wellness visit. Ask your health care provider how often you should have your eyes and teeth checked. Stay up to date on all vaccines. This information is not intended to replace advice given to you by your health care provider. Make sure you discuss any questions you have with your health care provider. Document Revised: 06/16/2020 Document Reviewed: 06/16/2020 Elsevier Patient Education  [place].

## 2021-10-19 NOTE — Assessment & Plan Note (Signed)
Tetanus UTD, declines influenza and HPV vaccines. Pap smear due next year.  Commended her on regular exercise and a fair diet. Exam today unremarkable. Labs pending.

## 2021-10-19 NOTE — Assessment & Plan Note (Signed)
Mild reduction in WBC count earlier this year. Likely benign.  Repeat CBC pending. Add differential. Consider pathology smear if warranted.  No alarm signs.

## 2021-10-20 LAB — HEPATITIS C ANTIBODY: Hepatitis C Ab: NONREACTIVE

## 2021-10-20 LAB — HIV ANTIBODY (ROUTINE TESTING W REFLEX): HIV 1&2 Ab, 4th Generation: NONREACTIVE

## 2021-10-25 DIAGNOSIS — F4320 Adjustment disorder, unspecified: Secondary | ICD-10-CM | POA: Diagnosis not present

## 2021-10-27 DIAGNOSIS — M25562 Pain in left knee: Secondary | ICD-10-CM | POA: Diagnosis not present

## 2021-11-01 DIAGNOSIS — F4320 Adjustment disorder, unspecified: Secondary | ICD-10-CM | POA: Diagnosis not present

## 2021-11-08 DIAGNOSIS — F4320 Adjustment disorder, unspecified: Secondary | ICD-10-CM | POA: Diagnosis not present

## 2021-11-15 DIAGNOSIS — F4320 Adjustment disorder, unspecified: Secondary | ICD-10-CM | POA: Diagnosis not present

## 2021-11-22 DIAGNOSIS — F4320 Adjustment disorder, unspecified: Secondary | ICD-10-CM | POA: Diagnosis not present

## 2021-11-29 DIAGNOSIS — F4320 Adjustment disorder, unspecified: Secondary | ICD-10-CM | POA: Diagnosis not present

## 2021-11-29 DIAGNOSIS — M222X2 Patellofemoral disorders, left knee: Secondary | ICD-10-CM | POA: Diagnosis not present

## 2021-12-06 ENCOUNTER — Telehealth: Payer: Self-pay | Admitting: Primary Care

## 2021-12-06 DIAGNOSIS — F4320 Adjustment disorder, unspecified: Secondary | ICD-10-CM | POA: Diagnosis not present

## 2021-12-06 NOTE — Telephone Encounter (Signed)
I spoke with pt; pt said that she did get injury at work; pt reported to her manager but pt is not sure if paper work was done. Pt said she stepped back to avoid someone running into her and pt hit corner of wooden railing at stairs.  Pt said injury happened on 12/04/21 at 12 noon. Pt said has persistent H/A with pain level of 4-5; bright light sensitivity to eyes but no blurred or double vision and lightheadedness with quick movement.. Pt had already been scheduled for appt with Mayra Reel NP on 12/08/21 at 12:20.first available appt that was convenient for pt; Amy front office mgr said that [facility] [employer] does not see workers comp cases. I advised pt and pt will ck with employer if they have a provider of preference for pt to see. Cancelled appt on 12/08/21 and UC & ED precautions given and pt voiced understanding. Sending note to Allayne Gitelman NP and [facility].

## 2021-12-06 NOTE — Telephone Encounter (Signed)
[facility] - Client TELEPHONE ADVICE RECORD [facility] Patient Name: Jessica Roth Gender: Female DOB: 10/01/2000 Age: 21 Y 5 M 20 D Return Phone Number: [phone] (Primary) Address: City/ State/ Zip: [place] [address] Client [facility] - Client Client Site [facility] - Day Provider Vernona Rieger - NP Contact Type Call Who Is Calling Patient / Member / Family / Caregiver Call Type Triage / Clinical Relationship To Patient Self Return Phone Number [phone] (Primary) Chief Complaint HEAD INJURY - and not acting right. Change in behaviour after hitting head. Reason for Call Symptomatic / Request for Health Information Initial Comment Caller states she hit her head 2 days ago, strong headaches, bright light sensitivity and lightheadedness. Translation No Nurse Assessment Nurse: Gasper Sells, RN, Marylu Lund Date/Time Lamount Cohen Time): 12/06/2021 9:13:40 AM Confirm and document reason for call. If symptomatic, describe symptoms. ---Caller states she hit her head 2 days ago, strong headaches, bright light sensitivity and lightheadedness. Hit above ear on left side, no break in skin, at work. Reported to manager and no paperwork done at present. No LOC. Does the patient have any new or worsening symptoms? ---Yes Will a triage be completed? ---Yes Related visit to physician within the last 2 weeks? ---No Does the PT have any chronic conditions? (i.e. diabetes, asthma, this includes High risk factors for pregnancy, etc.) ---Yes List chronic conditions. ---zyrtec Is the patient pregnant or possibly pregnant? (Ask all females between the ages of 53-55) ---No Is this a behavioral health or substance abuse call? ---No Guidelines Guideline Title Affirmed Question Affirmed Notes Nurse Date/Time (Eastern Time) Head Injury Scalp swelling, bruise or pain Quentin Cornwall 12/06/2021  9:15:26 AM PLEASE NOTE: All timestamps contained within this report are represented as [place] Standard Time. CONFIDENTIALTY NOTICE: This fax transmission is intended only for the addressee. It contains information that is legally privileged, confidential or otherwise protected from use or disclosure. If you are not the intended recipient, you are strictly prohibited from reviewing, disclosing, copying using or disseminating any of this information or taking any action in reliance on or regarding this information. If you have received this fax in error, please notify us immediately by telephone so that we can arrange for its return to [place]. Phone: [phone], Toll-Free: [phone], Fax: [phone] Page: 2 of 2 Call Id: [id number] Disp. Time Lamount Cohen Time) Disposition Final User 12/06/2021 9:12:15 AM Send to Urgent Queue Dennison Mascot 12/06/2021 9:19:27 AM See PCP within 24 Hours Yes Gasper Sells, RN, Marylu Lund Final Disposition 12/06/2021 9:19:27 AM See PCP within 24 Hours Yes Gasper Sells, RN, Marylu Lund Disposition Overriden: Home Care Override Reason: Patient's symptoms need a higher level of care Caller Disagree/Comply Comply Caller Understands Yes PreDisposition Call Doctor Care Advice Given Per Guideline SEE PCP WITHIN 24 HOURS: * IF OFFICE WILL BE OPEN: You need to be examined within the next 24 hours. Call your doctor (or NP/PA) when the office opens and make an appointment. PAIN MEDICINES: * For pain relief, you can take either acetaminophen, ibuprofen, or naproxen. * They are over-the-counter (OTC) pain drugs. You can buy them at the drugstore. * ACETAMINOPHEN - REGULAR STRENGTH TYLENOL: Take 650 mg (two 325 mg pills) by mouth every 4 to 6 hours as needed. Each Regular Strength Tylenol pill has 325 mg of acetaminophen. The most you should take is 10 pills a day (3,250 mg total). Note: In [place], the maximum is 12 pills a day (3,900 mg total). * ACETAMINOPHEN - EXTRA  STRENGTH TYLENOL: Take 1,000  mg (two 500 mg pills) every 6 to 8 hours as needed. Each Extra Strength Tylenol pill has 500 mg of acetaminophen. The most you should take is 6 pills a day (3,000 mg total). Note: In [place], the maximum is 8 pills a day (4,000 mg total). * IBUPROFEN (E.G., MOTRIN, ADVIL): Take 400 mg (two 200 mg pills) by mouth every 6 hours. The most you should take is 6 pills a day (1,200 mg total). CARE ADVICE given per Head Injury (Adult) guideline. CALL BACK IF: * Doesn't heal within 10 days * You become worse Referrals REFERRED TO PCP OFFIC

## 2021-12-06 NOTE — Telephone Encounter (Signed)
Patient called and stated she hit her head at work two days ago and she has been experiencing slight dizziness with quick movement, strong headaches, problems with bright lights. Patient was sent to access nurse.

## 2021-12-06 NOTE — Telephone Encounter (Signed)
I am happy to see her if her employer will allow.  Historically, employers tend to prefer specific healthcare providers for [employer]. cases.  If this is not a Workmen's Comp. case then still happy to see her.

## 2021-12-08 ENCOUNTER — Ambulatory Visit: Payer: BC Managed Care – PPO | Admitting: Primary Care

## 2021-12-08 DIAGNOSIS — M25462 Effusion, left knee: Secondary | ICD-10-CM | POA: Diagnosis not present

## 2021-12-08 DIAGNOSIS — M7652 Patellar tendinitis, left knee: Secondary | ICD-10-CM | POA: Diagnosis not present

## 2021-12-08 DIAGNOSIS — M25562 Pain in left knee: Secondary | ICD-10-CM | POA: Diagnosis not present

## 2021-12-13 DIAGNOSIS — F4320 Adjustment disorder, unspecified: Secondary | ICD-10-CM | POA: Diagnosis not present

## 2021-12-15 DIAGNOSIS — M25462 Effusion, left knee: Secondary | ICD-10-CM | POA: Diagnosis not present

## 2021-12-15 DIAGNOSIS — M7652 Patellar tendinitis, left knee: Secondary | ICD-10-CM | POA: Diagnosis not present

## 2021-12-15 DIAGNOSIS — M25562 Pain in left knee: Secondary | ICD-10-CM | POA: Diagnosis not present

## 2021-12-20 DIAGNOSIS — F4320 Adjustment disorder, unspecified: Secondary | ICD-10-CM | POA: Diagnosis not present

## 2022-01-10 DIAGNOSIS — F4320 Adjustment disorder, unspecified: Secondary | ICD-10-CM | POA: Diagnosis not present

## 2022-01-17 DIAGNOSIS — F4320 Adjustment disorder, unspecified: Secondary | ICD-10-CM | POA: Diagnosis not present

## 2022-01-19 DIAGNOSIS — M7652 Patellar tendinitis, left knee: Secondary | ICD-10-CM | POA: Diagnosis not present

## 2022-01-19 DIAGNOSIS — M25562 Pain in left knee: Secondary | ICD-10-CM | POA: Diagnosis not present

## 2022-01-19 DIAGNOSIS — M25462 Effusion, left knee: Secondary | ICD-10-CM | POA: Diagnosis not present

## 2022-01-24 DIAGNOSIS — F4320 Adjustment disorder, unspecified: Secondary | ICD-10-CM | POA: Diagnosis not present

## 2022-01-31 DIAGNOSIS — F4320 Adjustment disorder, unspecified: Secondary | ICD-10-CM | POA: Diagnosis not present

## 2022-02-07 DIAGNOSIS — F4320 Adjustment disorder, unspecified: Secondary | ICD-10-CM | POA: Diagnosis not present

## 2022-02-14 DIAGNOSIS — F4320 Adjustment disorder, unspecified: Secondary | ICD-10-CM | POA: Diagnosis not present

## 2022-02-21 DIAGNOSIS — F4320 Adjustment disorder, unspecified: Secondary | ICD-10-CM | POA: Diagnosis not present

## 2022-02-28 DIAGNOSIS — F4320 Adjustment disorder, unspecified: Secondary | ICD-10-CM | POA: Diagnosis not present

## 2022-03-03 ENCOUNTER — Encounter: Payer: Self-pay | Admitting: Primary Care

## 2022-03-03 ENCOUNTER — Ambulatory Visit (INDEPENDENT_AMBULATORY_CARE_PROVIDER_SITE_OTHER): Payer: BC Managed Care – PPO | Admitting: Primary Care

## 2022-03-03 VITALS — BP 108/80 | HR 82 | Temp 97.5°F | Ht 64.0 in | Wt 149.0 lb

## 2022-03-03 DIAGNOSIS — J309 Allergic rhinitis, unspecified: Secondary | ICD-10-CM

## 2022-03-03 NOTE — Assessment & Plan Note (Signed)
Symptoms suggestive of allergy involvement.  No infection on exam.  Discussed use of Flonase BID, resume Xyzal. Consider low dose prednisone if no improvement. She will update if that is the case.

## 2022-03-03 NOTE — Patient Instructions (Signed)
Try using Flonase (fluticasone) nasal spray. Instill 1 spray in each nostril twice daily.   Resume your Xyzal allergy pill.  It was a pleasure to see you today!

## 2022-03-03 NOTE — Progress Notes (Signed)
Subjective:    Patient ID: Jessica Roth, female    DOB: 08-27-2000, 22 y.o.   MRN: [id number]  HPI  Jessica Roth is a very pleasant 22 y.o. female with a history of allergic rhinitis who presents today to discuss ear pain.  Symptom onset five days ago with pain and fullness to the right ear. Today she noticed some post nasal drip.   She denies sore throat, cough, fevers, chills, sinus pressure. She ran out of her Xyzal medication two days ago, typically takes this year round.    Review of Systems  Constitutional:  Negative for chills and fever.  HENT:  Positive for ear pain and postnasal drip. Negative for congestion, rhinorrhea, sinus pressure, sneezing and sore throat.   Respiratory:  Negative for cough.   Allergic/Immunologic: Positive for environmental allergies.  Neurological:  Negative for headaches.         Past Medical History:  Diagnosis Date   Acute foot pain, left 08/18/2021   Acute pain of right foot 06/24/2019   Decrease in appetite 04/15/2020   Knee pain, left 06/17/2014   Pneumonia    Seasonal allergies     Social History   Socioeconomic History   Marital status: Single    Spouse name: Not on file   Number of children: Not on file   Years of education: Not on file   Highest education level: Not on file  Occupational History   Not on file  Tobacco Use   Smoking status: Never   Smokeless tobacco: Never  Vaping Use   Vaping Use: Never used  Substance and Sexual Activity   Alcohol use: No    Alcohol/week: 0.0 standard drinks of alcohol   Drug use: Never   Sexual activity: Not on file  Other Topics Concern   Not on file  Social History Narrative   Going into 8th grade.   Enjoys playing soccer and plays on a team.   Favorite food is pasta, pizza, fruit.   Favorite color is black.   2 sisters and 1 brother   Social Determinants of Radio broadcast assistant Strain: Not on file  Food Insecurity: Not on file  Transportation Needs: Not on file   Physical Activity: Not on file  Stress: Not on file  Social Connections: Not on file  Intimate Partner Violence: Not on file    History reviewed. No pertinent surgical history.  Family History  Adopted: Yes    No Known Allergies  Current Outpatient Medications on File Prior to Visit  Medication Sig Dispense Refill   levocetirizine (XYZAL) 5 MG tablet Take 5 mg by mouth every evening.     No current facility-administered medications on file prior to visit.    BP 108/80   Pulse 82   Temp (!) 97.5 F (36.4 C) (Temporal)   Ht '5\' 4"'$  (1.626 m)   Wt 149 lb (67.6 kg)   LMP 02/21/2022 (Approximate)   SpO2 98%   BMI 25.58 kg/m  Objective:   Physical Exam HENT:     Right Ear: Ear canal normal. Tympanic membrane is retracted. Tympanic membrane is not erythematous.     Left Ear: Ear canal normal. Tympanic membrane is bulging. Tympanic membrane is not erythematous.     Nose:     Right Sinus: No maxillary sinus tenderness or frontal sinus tenderness.     Left Sinus: No maxillary sinus tenderness or frontal sinus tenderness.     Mouth/Throat:     Pharynx: No  posterior oropharyngeal erythema.  Eyes:     Conjunctiva/sclera: Conjunctivae normal.  Cardiovascular:     Rate and Rhythm: Normal rate.  Pulmonary:     Effort: Pulmonary effort is normal.     Breath sounds: Normal breath sounds. No wheezing or rales.  Musculoskeletal:     Cervical back: Neck supple.  Skin:    General: Skin is warm and dry.           Assessment & Plan:  Allergic rhinitis, unspecified seasonality, unspecified trigger Assessment & Plan: Symptoms suggestive of allergy involvement.  No infection on exam.  Discussed use of Flonase BID, resume Xyzal. Consider low dose prednisone if no improvement. She will update if that is the case.          Pleas Koch, NP

## 2022-03-07 DIAGNOSIS — F4320 Adjustment disorder, unspecified: Secondary | ICD-10-CM | POA: Diagnosis not present

## 2022-03-14 DIAGNOSIS — F4320 Adjustment disorder, unspecified: Secondary | ICD-10-CM | POA: Diagnosis not present

## 2022-03-21 DIAGNOSIS — F4320 Adjustment disorder, unspecified: Secondary | ICD-10-CM | POA: Diagnosis not present

## 2022-03-23 DIAGNOSIS — F4320 Adjustment disorder, unspecified: Secondary | ICD-10-CM | POA: Diagnosis not present

## 2022-04-03 DIAGNOSIS — F4320 Adjustment disorder, unspecified: Secondary | ICD-10-CM | POA: Diagnosis not present

## 2022-04-17 DIAGNOSIS — F4320 Adjustment disorder, unspecified: Secondary | ICD-10-CM | POA: Diagnosis not present

## 2022-05-01 DIAGNOSIS — F4320 Adjustment disorder, unspecified: Secondary | ICD-10-CM | POA: Diagnosis not present

## 2022-05-03 DIAGNOSIS — F4320 Adjustment disorder, unspecified: Secondary | ICD-10-CM | POA: Diagnosis not present

## 2022-05-10 DIAGNOSIS — F4320 Adjustment disorder, unspecified: Secondary | ICD-10-CM | POA: Diagnosis not present

## 2022-05-15 DIAGNOSIS — F4320 Adjustment disorder, unspecified: Secondary | ICD-10-CM | POA: Diagnosis not present

## 2022-05-16 DIAGNOSIS — F4320 Adjustment disorder, unspecified: Secondary | ICD-10-CM | POA: Diagnosis not present

## 2022-05-24 DIAGNOSIS — F4320 Adjustment disorder, unspecified: Secondary | ICD-10-CM | POA: Diagnosis not present

## 2022-05-25 DIAGNOSIS — F4320 Adjustment disorder, unspecified: Secondary | ICD-10-CM | POA: Diagnosis not present

## 2022-06-01 ENCOUNTER — Encounter: Payer: Self-pay | Admitting: Primary Care

## 2022-06-01 ENCOUNTER — Ambulatory Visit: Payer: BC Managed Care – PPO | Admitting: Primary Care

## 2022-06-01 VITALS — BP 132/84 | HR 95 | Temp 98.1°F | Ht 64.0 in | Wt 141.0 lb

## 2022-06-01 DIAGNOSIS — J309 Allergic rhinitis, unspecified: Secondary | ICD-10-CM | POA: Diagnosis not present

## 2022-06-01 NOTE — Assessment & Plan Note (Signed)
Symptoms and exam today representative of allergy cause.  Continue Xyzal 5 mg daily. Continue Flonase twice daily.  Discussed the importance of voice rest, plenty of water.  Work note provided to excuse her for the rest of the week.  She will update if symptoms persist into the following week, or if she develops sick symptoms/starts feeling poorly.

## 2022-06-01 NOTE — Progress Notes (Signed)
Subjective:    Patient ID: Jessica Roth, female    DOB: [birth year], 21 y.o.   MRN: [id number]  Cough Associated symptoms include postnasal drip. Pertinent negatives include no chills, fever, headaches or sore throat.    Jessica Roth is a very pleasant 22 y.o. female with a history of allergic rhinitis who presents today to discuss cough.  Symptom onset 2 days ago with losing her voice. She was unable to talk upon waking that day. She then developed a cough, postnasal drip, nasal congestion.  She denies sore throat, fever, body aches, chills. She feels well overall, has been exercising per usual. She has not tested for Covid-19 infection. Her most bothersome symptom is voice hoarseness.   She has been taking Xyzal for which she started three days ago, also Flonase, Mucinex, OTC cough syrup.  She has been out of work since 05/30/2022 and is needing a work excuse. She has been trying to rest her voice.    Review of Systems  Constitutional:  Negative for chills, fatigue and fever.  HENT:  Positive for congestion and postnasal drip. Negative for sinus pressure and sore throat.   Respiratory:  Positive for cough.   Neurological:  Negative for headaches.         Past Medical History:  Diagnosis Date   Acute foot pain, left 08/18/2021   Acute pain of right foot 06/24/2019   Decrease in appetite 04/15/2020   Knee pain, left 06/17/2014   Pneumonia    Seasonal allergies     Social History   Socioeconomic History   Marital status: Single    Spouse name: Not on file   Number of children: Not on file   Years of education: Not on file   Highest education level: Not on file  Occupational History   Not on file  Tobacco Use   Smoking status: Never   Smokeless tobacco: Never  Vaping Use   Vaping Use: Never used  Substance and Sexual Activity   Alcohol use: No    Alcohol/week: 0.0 standard drinks of alcohol   Drug use: Never   Sexual activity: Not on file  Other Topics Concern    Not on file  Social History Narrative   Going into 8th grade.   Enjoys playing soccer and plays on a team.   Favorite food is pasta, pizza, fruit.   Favorite color is black.   2 sisters and 1 brother   Social Determinants of Corporate investment banker Strain: Not on file  Food Insecurity: Not on file  Transportation Needs: Not on file  Physical Activity: Not on file  Stress: Not on file  Social Connections: Not on file  Intimate Partner Violence: Not on file    History reviewed. No pertinent surgical history.  Family History  Adopted: Yes    No Known Allergies  Current Outpatient Medications on File Prior to Visit  Medication Sig Dispense Refill   levocetirizine (XYZAL) 5 MG tablet Take 5 mg by mouth every evening.     No current facility-administered medications on file prior to visit.    BP 132/84   Pulse 95   Temp 98.1 F (36.7 C) (Temporal)   Ht 5\' 4"  (1.626 m)   Wt 141 lb (64 kg)   LMP 05/28/2022 (Exact Date)   SpO2 97%   BMI 24.20 kg/m  Objective:   Physical Exam Constitutional:      Appearance: She is not ill-appearing.  HENT:     Head:  Comments: Voice hoarseness today during exam    Right Ear: Tympanic membrane and ear canal normal.     Left Ear: Tympanic membrane and ear canal normal.     Nose:     Right Sinus: No maxillary sinus tenderness or frontal sinus tenderness.     Left Sinus: No maxillary sinus tenderness or frontal sinus tenderness.     Mouth/Throat:     Pharynx: No posterior oropharyngeal erythema.     Comments: Post nasal drip noted Eyes:     Conjunctiva/sclera: Conjunctivae normal.  Cardiovascular:     Rate and Rhythm: Normal rate and regular rhythm.  Pulmonary:     Effort: Pulmonary effort is normal.     Breath sounds: Normal breath sounds. No wheezing or rales.  Musculoskeletal:     Cervical back: Neck supple.  Lymphadenopathy:     Cervical: No cervical adenopathy.  Skin:    General: Skin is warm and dry.            Assessment & Plan:  Allergic rhinitis, unspecified seasonality, unspecified trigger Assessment & Plan: Symptoms and exam today representative of allergy cause.  Continue Xyzal 5 mg daily. Continue Flonase twice daily.  Discussed the importance of voice rest, plenty of water.  Work note provided to excuse her for the rest of the week.  She will update if symptoms persist into the following week, or if she develops sick symptoms/starts feeling poorly.         Doreene Nest, NP

## 2022-06-01 NOTE — Patient Instructions (Signed)
Continue taking Xyzal daily for allergies.  Continue Flonase twice daily.  Try to rest your voice is much as possible.  Drink plenty of water every day.  It was a pleasure to see you today!

## 2022-06-14 DIAGNOSIS — F4320 Adjustment disorder, unspecified: Secondary | ICD-10-CM | POA: Diagnosis not present

## 2022-06-28 DIAGNOSIS — F4320 Adjustment disorder, unspecified: Secondary | ICD-10-CM | POA: Diagnosis not present

## 2022-06-29 DIAGNOSIS — F4320 Adjustment disorder, unspecified: Secondary | ICD-10-CM | POA: Diagnosis not present

## 2022-07-17 DIAGNOSIS — F4320 Adjustment disorder, unspecified: Secondary | ICD-10-CM | POA: Diagnosis not present

## 2022-07-24 DIAGNOSIS — F4320 Adjustment disorder, unspecified: Secondary | ICD-10-CM | POA: Diagnosis not present

## 2022-07-31 DIAGNOSIS — L2089 Other atopic dermatitis: Secondary | ICD-10-CM | POA: Diagnosis not present

## 2022-07-31 DIAGNOSIS — H1045 Other chronic allergic conjunctivitis: Secondary | ICD-10-CM | POA: Diagnosis not present

## 2022-07-31 DIAGNOSIS — J3089 Other allergic rhinitis: Secondary | ICD-10-CM | POA: Diagnosis not present

## 2022-07-31 DIAGNOSIS — J301 Allergic rhinitis due to pollen: Secondary | ICD-10-CM | POA: Diagnosis not present

## 2022-08-07 DIAGNOSIS — F4320 Adjustment disorder, unspecified: Secondary | ICD-10-CM | POA: Diagnosis not present

## 2022-08-08 DIAGNOSIS — J301 Allergic rhinitis due to pollen: Secondary | ICD-10-CM | POA: Diagnosis not present

## 2022-08-09 DIAGNOSIS — J3081 Allergic rhinitis due to animal (cat) (dog) hair and dander: Secondary | ICD-10-CM | POA: Diagnosis not present

## 2022-08-09 DIAGNOSIS — J3089 Other allergic rhinitis: Secondary | ICD-10-CM | POA: Diagnosis not present

## 2022-08-14 DIAGNOSIS — F4320 Adjustment disorder, unspecified: Secondary | ICD-10-CM | POA: Diagnosis not present

## 2022-08-15 DIAGNOSIS — M545 Low back pain, unspecified: Secondary | ICD-10-CM | POA: Diagnosis not present

## 2022-08-15 DIAGNOSIS — M25512 Pain in left shoulder: Secondary | ICD-10-CM | POA: Diagnosis not present

## 2022-08-15 DIAGNOSIS — M542 Cervicalgia: Secondary | ICD-10-CM | POA: Diagnosis not present

## 2022-08-15 DIAGNOSIS — M25551 Pain in right hip: Secondary | ICD-10-CM | POA: Diagnosis not present

## 2022-08-17 DIAGNOSIS — J3081 Allergic rhinitis due to animal (cat) (dog) hair and dander: Secondary | ICD-10-CM | POA: Diagnosis not present

## 2022-08-17 DIAGNOSIS — J301 Allergic rhinitis due to pollen: Secondary | ICD-10-CM | POA: Diagnosis not present

## 2022-08-17 DIAGNOSIS — J3089 Other allergic rhinitis: Secondary | ICD-10-CM | POA: Diagnosis not present

## 2022-08-21 DIAGNOSIS — F4320 Adjustment disorder, unspecified: Secondary | ICD-10-CM | POA: Diagnosis not present

## 2022-08-22 DIAGNOSIS — J301 Allergic rhinitis due to pollen: Secondary | ICD-10-CM | POA: Diagnosis not present

## 2022-08-22 DIAGNOSIS — J3089 Other allergic rhinitis: Secondary | ICD-10-CM | POA: Diagnosis not present

## 2022-08-22 DIAGNOSIS — J3081 Allergic rhinitis due to animal (cat) (dog) hair and dander: Secondary | ICD-10-CM | POA: Diagnosis not present

## 2022-08-25 DIAGNOSIS — J3089 Other allergic rhinitis: Secondary | ICD-10-CM | POA: Diagnosis not present

## 2022-08-25 DIAGNOSIS — J3081 Allergic rhinitis due to animal (cat) (dog) hair and dander: Secondary | ICD-10-CM | POA: Diagnosis not present

## 2022-08-25 DIAGNOSIS — J301 Allergic rhinitis due to pollen: Secondary | ICD-10-CM | POA: Diagnosis not present

## 2022-08-28 DIAGNOSIS — F4320 Adjustment disorder, unspecified: Secondary | ICD-10-CM | POA: Diagnosis not present

## 2022-09-08 DIAGNOSIS — J3081 Allergic rhinitis due to animal (cat) (dog) hair and dander: Secondary | ICD-10-CM | POA: Diagnosis not present

## 2022-09-08 DIAGNOSIS — J3089 Other allergic rhinitis: Secondary | ICD-10-CM | POA: Diagnosis not present

## 2022-09-08 DIAGNOSIS — J301 Allergic rhinitis due to pollen: Secondary | ICD-10-CM | POA: Diagnosis not present

## 2022-09-11 DIAGNOSIS — J3081 Allergic rhinitis due to animal (cat) (dog) hair and dander: Secondary | ICD-10-CM | POA: Diagnosis not present

## 2022-09-11 DIAGNOSIS — J3089 Other allergic rhinitis: Secondary | ICD-10-CM | POA: Diagnosis not present

## 2022-09-11 DIAGNOSIS — J301 Allergic rhinitis due to pollen: Secondary | ICD-10-CM | POA: Diagnosis not present

## 2022-09-13 DIAGNOSIS — J301 Allergic rhinitis due to pollen: Secondary | ICD-10-CM | POA: Diagnosis not present

## 2022-09-13 DIAGNOSIS — J3089 Other allergic rhinitis: Secondary | ICD-10-CM | POA: Diagnosis not present

## 2022-09-13 DIAGNOSIS — J3081 Allergic rhinitis due to animal (cat) (dog) hair and dander: Secondary | ICD-10-CM | POA: Diagnosis not present

## 2022-09-14 DIAGNOSIS — F4320 Adjustment disorder, unspecified: Secondary | ICD-10-CM | POA: Diagnosis not present

## 2022-09-18 DIAGNOSIS — F4320 Adjustment disorder, unspecified: Secondary | ICD-10-CM | POA: Diagnosis not present

## 2022-09-20 DIAGNOSIS — J3081 Allergic rhinitis due to animal (cat) (dog) hair and dander: Secondary | ICD-10-CM | POA: Diagnosis not present

## 2022-09-20 DIAGNOSIS — J3089 Other allergic rhinitis: Secondary | ICD-10-CM | POA: Diagnosis not present

## 2022-09-20 DIAGNOSIS — J301 Allergic rhinitis due to pollen: Secondary | ICD-10-CM | POA: Diagnosis not present

## 2022-09-25 DIAGNOSIS — J3089 Other allergic rhinitis: Secondary | ICD-10-CM | POA: Diagnosis not present

## 2022-09-25 DIAGNOSIS — J301 Allergic rhinitis due to pollen: Secondary | ICD-10-CM | POA: Diagnosis not present

## 2022-09-25 DIAGNOSIS — F4320 Adjustment disorder, unspecified: Secondary | ICD-10-CM | POA: Diagnosis not present

## 2022-09-25 DIAGNOSIS — J3081 Allergic rhinitis due to animal (cat) (dog) hair and dander: Secondary | ICD-10-CM | POA: Diagnosis not present

## 2022-09-27 DIAGNOSIS — J301 Allergic rhinitis due to pollen: Secondary | ICD-10-CM | POA: Diagnosis not present

## 2022-09-27 DIAGNOSIS — J3089 Other allergic rhinitis: Secondary | ICD-10-CM | POA: Diagnosis not present

## 2022-09-27 DIAGNOSIS — J3081 Allergic rhinitis due to animal (cat) (dog) hair and dander: Secondary | ICD-10-CM | POA: Diagnosis not present

## 2022-10-05 DIAGNOSIS — F4320 Adjustment disorder, unspecified: Secondary | ICD-10-CM | POA: Diagnosis not present

## 2022-10-11 DIAGNOSIS — J301 Allergic rhinitis due to pollen: Secondary | ICD-10-CM | POA: Diagnosis not present

## 2022-10-11 DIAGNOSIS — J3081 Allergic rhinitis due to animal (cat) (dog) hair and dander: Secondary | ICD-10-CM | POA: Diagnosis not present

## 2022-10-11 DIAGNOSIS — J3089 Other allergic rhinitis: Secondary | ICD-10-CM | POA: Diagnosis not present

## 2022-10-16 DIAGNOSIS — J3089 Other allergic rhinitis: Secondary | ICD-10-CM | POA: Diagnosis not present

## 2022-10-16 DIAGNOSIS — J3081 Allergic rhinitis due to animal (cat) (dog) hair and dander: Secondary | ICD-10-CM | POA: Diagnosis not present

## 2022-10-16 DIAGNOSIS — F4320 Adjustment disorder, unspecified: Secondary | ICD-10-CM | POA: Diagnosis not present

## 2022-10-16 DIAGNOSIS — J301 Allergic rhinitis due to pollen: Secondary | ICD-10-CM | POA: Diagnosis not present

## 2022-10-23 DIAGNOSIS — J301 Allergic rhinitis due to pollen: Secondary | ICD-10-CM | POA: Diagnosis not present

## 2022-10-23 DIAGNOSIS — F4320 Adjustment disorder, unspecified: Secondary | ICD-10-CM | POA: Diagnosis not present

## 2022-10-23 DIAGNOSIS — J3089 Other allergic rhinitis: Secondary | ICD-10-CM | POA: Diagnosis not present

## 2022-10-23 DIAGNOSIS — J3081 Allergic rhinitis due to animal (cat) (dog) hair and dander: Secondary | ICD-10-CM | POA: Diagnosis not present

## 2022-10-25 DIAGNOSIS — J301 Allergic rhinitis due to pollen: Secondary | ICD-10-CM | POA: Diagnosis not present

## 2022-10-25 DIAGNOSIS — J3081 Allergic rhinitis due to animal (cat) (dog) hair and dander: Secondary | ICD-10-CM | POA: Diagnosis not present

## 2022-10-25 DIAGNOSIS — J3089 Other allergic rhinitis: Secondary | ICD-10-CM | POA: Diagnosis not present

## 2022-10-30 DIAGNOSIS — J3081 Allergic rhinitis due to animal (cat) (dog) hair and dander: Secondary | ICD-10-CM | POA: Diagnosis not present

## 2022-10-30 DIAGNOSIS — J3089 Other allergic rhinitis: Secondary | ICD-10-CM | POA: Diagnosis not present

## 2022-10-30 DIAGNOSIS — J301 Allergic rhinitis due to pollen: Secondary | ICD-10-CM | POA: Diagnosis not present

## 2022-11-02 DIAGNOSIS — F4320 Adjustment disorder, unspecified: Secondary | ICD-10-CM | POA: Diagnosis not present

## 2022-11-03 DIAGNOSIS — Z124 Encounter for screening for malignant neoplasm of cervix: Secondary | ICD-10-CM | POA: Diagnosis not present

## 2022-11-03 DIAGNOSIS — Z6824 Body mass index (BMI) 24.0-24.9, adult: Secondary | ICD-10-CM | POA: Diagnosis not present

## 2022-11-03 DIAGNOSIS — Z113 Encounter for screening for infections with a predominantly sexual mode of transmission: Secondary | ICD-10-CM | POA: Diagnosis not present

## 2022-11-03 DIAGNOSIS — Z01419 Encounter for gynecological examination (general) (routine) without abnormal findings: Secondary | ICD-10-CM | POA: Diagnosis not present

## 2022-11-06 DIAGNOSIS — J3081 Allergic rhinitis due to animal (cat) (dog) hair and dander: Secondary | ICD-10-CM | POA: Diagnosis not present

## 2022-11-06 DIAGNOSIS — F4320 Adjustment disorder, unspecified: Secondary | ICD-10-CM | POA: Diagnosis not present

## 2022-11-06 DIAGNOSIS — J301 Allergic rhinitis due to pollen: Secondary | ICD-10-CM | POA: Diagnosis not present

## 2022-11-06 DIAGNOSIS — J3089 Other allergic rhinitis: Secondary | ICD-10-CM | POA: Diagnosis not present

## 2022-11-10 DIAGNOSIS — J3089 Other allergic rhinitis: Secondary | ICD-10-CM | POA: Diagnosis not present

## 2022-11-10 DIAGNOSIS — J3081 Allergic rhinitis due to animal (cat) (dog) hair and dander: Secondary | ICD-10-CM | POA: Diagnosis not present

## 2022-11-10 DIAGNOSIS — J301 Allergic rhinitis due to pollen: Secondary | ICD-10-CM | POA: Diagnosis not present

## 2022-11-13 DIAGNOSIS — J3081 Allergic rhinitis due to animal (cat) (dog) hair and dander: Secondary | ICD-10-CM | POA: Diagnosis not present

## 2022-11-13 DIAGNOSIS — J3089 Other allergic rhinitis: Secondary | ICD-10-CM | POA: Diagnosis not present

## 2022-11-13 DIAGNOSIS — J301 Allergic rhinitis due to pollen: Secondary | ICD-10-CM | POA: Diagnosis not present

## 2022-11-13 DIAGNOSIS — F4320 Adjustment disorder, unspecified: Secondary | ICD-10-CM | POA: Diagnosis not present

## 2022-11-20 DIAGNOSIS — J3089 Other allergic rhinitis: Secondary | ICD-10-CM | POA: Diagnosis not present

## 2022-11-20 DIAGNOSIS — J301 Allergic rhinitis due to pollen: Secondary | ICD-10-CM | POA: Diagnosis not present

## 2022-11-20 DIAGNOSIS — J3081 Allergic rhinitis due to animal (cat) (dog) hair and dander: Secondary | ICD-10-CM | POA: Diagnosis not present

## 2022-11-23 DIAGNOSIS — F4320 Adjustment disorder, unspecified: Secondary | ICD-10-CM | POA: Diagnosis not present

## 2022-11-27 DIAGNOSIS — J301 Allergic rhinitis due to pollen: Secondary | ICD-10-CM | POA: Diagnosis not present

## 2022-11-27 DIAGNOSIS — J3089 Other allergic rhinitis: Secondary | ICD-10-CM | POA: Diagnosis not present

## 2022-11-27 DIAGNOSIS — F4320 Adjustment disorder, unspecified: Secondary | ICD-10-CM | POA: Diagnosis not present

## 2022-11-27 DIAGNOSIS — J3081 Allergic rhinitis due to animal (cat) (dog) hair and dander: Secondary | ICD-10-CM | POA: Diagnosis not present

## 2022-12-04 DIAGNOSIS — J3089 Other allergic rhinitis: Secondary | ICD-10-CM | POA: Diagnosis not present

## 2022-12-04 DIAGNOSIS — J3081 Allergic rhinitis due to animal (cat) (dog) hair and dander: Secondary | ICD-10-CM | POA: Diagnosis not present

## 2022-12-04 DIAGNOSIS — F4320 Adjustment disorder, unspecified: Secondary | ICD-10-CM | POA: Diagnosis not present

## 2022-12-04 DIAGNOSIS — J301 Allergic rhinitis due to pollen: Secondary | ICD-10-CM | POA: Diagnosis not present

## 2022-12-11 DIAGNOSIS — J301 Allergic rhinitis due to pollen: Secondary | ICD-10-CM | POA: Diagnosis not present

## 2022-12-11 DIAGNOSIS — F4320 Adjustment disorder, unspecified: Secondary | ICD-10-CM | POA: Diagnosis not present

## 2022-12-11 DIAGNOSIS — J3081 Allergic rhinitis due to animal (cat) (dog) hair and dander: Secondary | ICD-10-CM | POA: Diagnosis not present

## 2022-12-11 DIAGNOSIS — J3089 Other allergic rhinitis: Secondary | ICD-10-CM | POA: Diagnosis not present

## 2022-12-14 DIAGNOSIS — S93401A Sprain of unspecified ligament of right ankle, initial encounter: Secondary | ICD-10-CM | POA: Diagnosis not present

## 2023-01-02 DIAGNOSIS — F4320 Adjustment disorder, unspecified: Secondary | ICD-10-CM | POA: Diagnosis not present

## 2023-01-22 DIAGNOSIS — J301 Allergic rhinitis due to pollen: Secondary | ICD-10-CM | POA: Diagnosis not present

## 2023-01-22 DIAGNOSIS — J3081 Allergic rhinitis due to animal (cat) (dog) hair and dander: Secondary | ICD-10-CM | POA: Diagnosis not present

## 2023-01-22 DIAGNOSIS — J3089 Other allergic rhinitis: Secondary | ICD-10-CM | POA: Diagnosis not present

## 2023-01-25 DIAGNOSIS — F4320 Adjustment disorder, unspecified: Secondary | ICD-10-CM | POA: Diagnosis not present

## 2023-01-29 DIAGNOSIS — M545 Low back pain, unspecified: Secondary | ICD-10-CM | POA: Diagnosis not present

## 2023-01-29 DIAGNOSIS — M25551 Pain in right hip: Secondary | ICD-10-CM | POA: Diagnosis not present

## 2023-01-29 DIAGNOSIS — J3089 Other allergic rhinitis: Secondary | ICD-10-CM | POA: Diagnosis not present

## 2023-01-29 DIAGNOSIS — J301 Allergic rhinitis due to pollen: Secondary | ICD-10-CM | POA: Diagnosis not present

## 2023-01-29 DIAGNOSIS — M542 Cervicalgia: Secondary | ICD-10-CM | POA: Diagnosis not present

## 2023-01-29 DIAGNOSIS — M25512 Pain in left shoulder: Secondary | ICD-10-CM | POA: Diagnosis not present

## 2023-01-29 DIAGNOSIS — J3081 Allergic rhinitis due to animal (cat) (dog) hair and dander: Secondary | ICD-10-CM | POA: Diagnosis not present

## 2023-02-01 DIAGNOSIS — F4320 Adjustment disorder, unspecified: Secondary | ICD-10-CM | POA: Diagnosis not present

## 2023-02-05 DIAGNOSIS — J3089 Other allergic rhinitis: Secondary | ICD-10-CM | POA: Diagnosis not present

## 2023-02-05 DIAGNOSIS — J301 Allergic rhinitis due to pollen: Secondary | ICD-10-CM | POA: Diagnosis not present

## 2023-02-05 DIAGNOSIS — J3081 Allergic rhinitis due to animal (cat) (dog) hair and dander: Secondary | ICD-10-CM | POA: Diagnosis not present

## 2023-02-08 DIAGNOSIS — M25562 Pain in left knee: Secondary | ICD-10-CM | POA: Diagnosis not present

## 2023-02-08 DIAGNOSIS — F4320 Adjustment disorder, unspecified: Secondary | ICD-10-CM | POA: Diagnosis not present

## 2023-02-08 DIAGNOSIS — S8392XA Sprain of unspecified site of left knee, initial encounter: Secondary | ICD-10-CM | POA: Diagnosis not present

## 2023-02-12 DIAGNOSIS — J3081 Allergic rhinitis due to animal (cat) (dog) hair and dander: Secondary | ICD-10-CM | POA: Diagnosis not present

## 2023-02-12 DIAGNOSIS — J301 Allergic rhinitis due to pollen: Secondary | ICD-10-CM | POA: Diagnosis not present

## 2023-02-12 DIAGNOSIS — J3089 Other allergic rhinitis: Secondary | ICD-10-CM | POA: Diagnosis not present

## 2023-02-14 DIAGNOSIS — M25562 Pain in left knee: Secondary | ICD-10-CM | POA: Diagnosis not present

## 2023-02-15 DIAGNOSIS — F4320 Adjustment disorder, unspecified: Secondary | ICD-10-CM | POA: Diagnosis not present

## 2023-02-19 DIAGNOSIS — J3089 Other allergic rhinitis: Secondary | ICD-10-CM | POA: Diagnosis not present

## 2023-02-19 DIAGNOSIS — J301 Allergic rhinitis due to pollen: Secondary | ICD-10-CM | POA: Diagnosis not present

## 2023-02-19 DIAGNOSIS — J3081 Allergic rhinitis due to animal (cat) (dog) hair and dander: Secondary | ICD-10-CM | POA: Diagnosis not present

## 2023-02-23 DIAGNOSIS — M25562 Pain in left knee: Secondary | ICD-10-CM | POA: Diagnosis not present

## 2023-02-26 DIAGNOSIS — F4320 Adjustment disorder, unspecified: Secondary | ICD-10-CM | POA: Diagnosis not present

## 2023-02-26 DIAGNOSIS — J3081 Allergic rhinitis due to animal (cat) (dog) hair and dander: Secondary | ICD-10-CM | POA: Diagnosis not present

## 2023-02-26 DIAGNOSIS — J3089 Other allergic rhinitis: Secondary | ICD-10-CM | POA: Diagnosis not present

## 2023-02-26 DIAGNOSIS — J301 Allergic rhinitis due to pollen: Secondary | ICD-10-CM | POA: Diagnosis not present

## 2023-03-01 DIAGNOSIS — J301 Allergic rhinitis due to pollen: Secondary | ICD-10-CM | POA: Diagnosis not present

## 2023-03-02 DIAGNOSIS — J3089 Other allergic rhinitis: Secondary | ICD-10-CM | POA: Diagnosis not present

## 2023-03-02 DIAGNOSIS — J3081 Allergic rhinitis due to animal (cat) (dog) hair and dander: Secondary | ICD-10-CM | POA: Diagnosis not present

## 2023-03-08 DIAGNOSIS — F4320 Adjustment disorder, unspecified: Secondary | ICD-10-CM | POA: Diagnosis not present

## 2023-03-08 DIAGNOSIS — M25512 Pain in left shoulder: Secondary | ICD-10-CM | POA: Diagnosis not present

## 2023-03-08 DIAGNOSIS — M545 Low back pain, unspecified: Secondary | ICD-10-CM | POA: Diagnosis not present

## 2023-03-08 DIAGNOSIS — M25551 Pain in right hip: Secondary | ICD-10-CM | POA: Diagnosis not present

## 2023-03-08 DIAGNOSIS — M542 Cervicalgia: Secondary | ICD-10-CM | POA: Diagnosis not present

## 2023-03-15 DIAGNOSIS — M25562 Pain in left knee: Secondary | ICD-10-CM | POA: Diagnosis not present

## 2023-03-15 DIAGNOSIS — F4320 Adjustment disorder, unspecified: Secondary | ICD-10-CM | POA: Diagnosis not present

## 2023-03-19 DIAGNOSIS — J301 Allergic rhinitis due to pollen: Secondary | ICD-10-CM | POA: Diagnosis not present

## 2023-03-19 DIAGNOSIS — J3089 Other allergic rhinitis: Secondary | ICD-10-CM | POA: Diagnosis not present

## 2023-03-19 DIAGNOSIS — J3081 Allergic rhinitis due to animal (cat) (dog) hair and dander: Secondary | ICD-10-CM | POA: Diagnosis not present

## 2023-03-22 DIAGNOSIS — M25512 Pain in left shoulder: Secondary | ICD-10-CM | POA: Diagnosis not present

## 2023-03-22 DIAGNOSIS — M542 Cervicalgia: Secondary | ICD-10-CM | POA: Diagnosis not present

## 2023-03-22 DIAGNOSIS — F4320 Adjustment disorder, unspecified: Secondary | ICD-10-CM | POA: Diagnosis not present

## 2023-03-22 DIAGNOSIS — M25551 Pain in right hip: Secondary | ICD-10-CM | POA: Diagnosis not present

## 2023-03-22 DIAGNOSIS — M545 Low back pain, unspecified: Secondary | ICD-10-CM | POA: Diagnosis not present

## 2023-03-26 DIAGNOSIS — J3089 Other allergic rhinitis: Secondary | ICD-10-CM | POA: Diagnosis not present

## 2023-03-26 DIAGNOSIS — J301 Allergic rhinitis due to pollen: Secondary | ICD-10-CM | POA: Diagnosis not present

## 2023-03-26 DIAGNOSIS — J3081 Allergic rhinitis due to animal (cat) (dog) hair and dander: Secondary | ICD-10-CM | POA: Diagnosis not present

## 2023-03-29 DIAGNOSIS — F4320 Adjustment disorder, unspecified: Secondary | ICD-10-CM | POA: Diagnosis not present

## 2023-03-30 DIAGNOSIS — M25562 Pain in left knee: Secondary | ICD-10-CM | POA: Diagnosis not present

## 2023-04-02 DIAGNOSIS — J3081 Allergic rhinitis due to animal (cat) (dog) hair and dander: Secondary | ICD-10-CM | POA: Diagnosis not present

## 2023-04-02 DIAGNOSIS — J301 Allergic rhinitis due to pollen: Secondary | ICD-10-CM | POA: Diagnosis not present

## 2023-04-02 DIAGNOSIS — J3089 Other allergic rhinitis: Secondary | ICD-10-CM | POA: Diagnosis not present

## 2023-04-05 DIAGNOSIS — F4320 Adjustment disorder, unspecified: Secondary | ICD-10-CM | POA: Diagnosis not present

## 2023-04-09 DIAGNOSIS — J3089 Other allergic rhinitis: Secondary | ICD-10-CM | POA: Diagnosis not present

## 2023-04-09 DIAGNOSIS — J3081 Allergic rhinitis due to animal (cat) (dog) hair and dander: Secondary | ICD-10-CM | POA: Diagnosis not present

## 2023-04-09 DIAGNOSIS — J301 Allergic rhinitis due to pollen: Secondary | ICD-10-CM | POA: Diagnosis not present

## 2023-04-10 DIAGNOSIS — M222X2 Patellofemoral disorders, left knee: Secondary | ICD-10-CM | POA: Diagnosis not present

## 2023-04-12 DIAGNOSIS — M25562 Pain in left knee: Secondary | ICD-10-CM | POA: Diagnosis not present

## 2023-04-12 DIAGNOSIS — M25462 Effusion, left knee: Secondary | ICD-10-CM | POA: Diagnosis not present

## 2023-04-12 DIAGNOSIS — F4320 Adjustment disorder, unspecified: Secondary | ICD-10-CM | POA: Diagnosis not present

## 2023-04-12 DIAGNOSIS — M7652 Patellar tendinitis, left knee: Secondary | ICD-10-CM | POA: Diagnosis not present

## 2023-04-24 DIAGNOSIS — M25462 Effusion, left knee: Secondary | ICD-10-CM | POA: Diagnosis not present

## 2023-04-24 DIAGNOSIS — M25562 Pain in left knee: Secondary | ICD-10-CM | POA: Diagnosis not present

## 2023-04-24 DIAGNOSIS — M7652 Patellar tendinitis, left knee: Secondary | ICD-10-CM | POA: Diagnosis not present

## 2023-04-26 DIAGNOSIS — F4320 Adjustment disorder, unspecified: Secondary | ICD-10-CM | POA: Diagnosis not present

## 2023-04-30 DIAGNOSIS — J301 Allergic rhinitis due to pollen: Secondary | ICD-10-CM | POA: Diagnosis not present

## 2023-04-30 DIAGNOSIS — J3089 Other allergic rhinitis: Secondary | ICD-10-CM | POA: Diagnosis not present

## 2023-04-30 DIAGNOSIS — J3081 Allergic rhinitis due to animal (cat) (dog) hair and dander: Secondary | ICD-10-CM | POA: Diagnosis not present

## 2023-05-03 DIAGNOSIS — F4320 Adjustment disorder, unspecified: Secondary | ICD-10-CM | POA: Diagnosis not present

## 2023-05-08 DIAGNOSIS — M7652 Patellar tendinitis, left knee: Secondary | ICD-10-CM | POA: Diagnosis not present

## 2023-05-08 DIAGNOSIS — M25562 Pain in left knee: Secondary | ICD-10-CM | POA: Diagnosis not present

## 2023-05-08 DIAGNOSIS — M25462 Effusion, left knee: Secondary | ICD-10-CM | POA: Diagnosis not present

## 2023-05-17 DIAGNOSIS — F4320 Adjustment disorder, unspecified: Secondary | ICD-10-CM | POA: Diagnosis not present

## 2023-05-21 DIAGNOSIS — J3081 Allergic rhinitis due to animal (cat) (dog) hair and dander: Secondary | ICD-10-CM | POA: Diagnosis not present

## 2023-05-21 DIAGNOSIS — J301 Allergic rhinitis due to pollen: Secondary | ICD-10-CM | POA: Diagnosis not present

## 2023-05-21 DIAGNOSIS — J3089 Other allergic rhinitis: Secondary | ICD-10-CM | POA: Diagnosis not present

## 2023-05-24 DIAGNOSIS — F4320 Adjustment disorder, unspecified: Secondary | ICD-10-CM | POA: Diagnosis not present

## 2023-05-30 DIAGNOSIS — J301 Allergic rhinitis due to pollen: Secondary | ICD-10-CM | POA: Diagnosis not present

## 2023-05-30 DIAGNOSIS — J3089 Other allergic rhinitis: Secondary | ICD-10-CM | POA: Diagnosis not present

## 2023-05-30 DIAGNOSIS — F4320 Adjustment disorder, unspecified: Secondary | ICD-10-CM | POA: Diagnosis not present

## 2023-05-30 DIAGNOSIS — L2089 Other atopic dermatitis: Secondary | ICD-10-CM | POA: Diagnosis not present

## 2023-05-30 DIAGNOSIS — J3081 Allergic rhinitis due to animal (cat) (dog) hair and dander: Secondary | ICD-10-CM | POA: Diagnosis not present

## 2023-05-30 DIAGNOSIS — H1045 Other chronic allergic conjunctivitis: Secondary | ICD-10-CM | POA: Diagnosis not present

## 2023-06-07 DIAGNOSIS — F4320 Adjustment disorder, unspecified: Secondary | ICD-10-CM | POA: Diagnosis not present

## 2023-06-14 DIAGNOSIS — F4320 Adjustment disorder, unspecified: Secondary | ICD-10-CM | POA: Diagnosis not present

## 2023-06-28 DIAGNOSIS — F4320 Adjustment disorder, unspecified: Secondary | ICD-10-CM | POA: Diagnosis not present

## 2023-07-04 ENCOUNTER — Ambulatory Visit: Admitting: Primary Care

## 2023-07-04 VITALS — BP 118/68 | HR 69 | Temp 98.6°F | Ht 64.0 in | Wt 145.0 lb

## 2023-07-04 DIAGNOSIS — F4320 Adjustment disorder, unspecified: Secondary | ICD-10-CM | POA: Diagnosis not present

## 2023-07-04 DIAGNOSIS — I8392 Asymptomatic varicose veins of left lower extremity: Secondary | ICD-10-CM | POA: Insufficient documentation

## 2023-07-04 NOTE — Patient Instructions (Signed)
 You will either be contacted via phone regarding your referral to vein and vascular, or you may receive a letter on your MyChart portal from our referral team with instructions for scheduling an appointment. Please let us  know if you have not been contacted by anyone within two weeks.  It was a pleasure to see you today!

## 2023-07-04 NOTE — Progress Notes (Signed)
 Subjective:    Patient ID: Jessica Roth, female    DOB: 2000/01/11, 23 y.o.   MRN: [id number]  HPI  Jessica Roth is a very pleasant 23 y.o. female without a significant medical history who presents today requesting vascular referral.  She is requesting a vascular referral due to some bulging veins to the left lower extremity. She first noticed her veins a few years ago. Since then she's noticed new veins popping up to the left lower extremity.   She works several jobs for which require standing and walking for long periods of time. She also plays competitive soccer. She denies pain to her veins and lower extremity pain.   She wasn't bothered by her veins until her aunt and mother pointed them out and recommended she get treatment.  She has not tried compression socks.   Review of Systems  Cardiovascular:  Negative for leg swelling.       Varicose veins  Musculoskeletal:  Negative for arthralgias and back pain.  Skin:  Negative for color change.         Past Medical History:  Diagnosis Date   Acute foot pain, left 08/18/2021   Acute pain of right foot 06/24/2019   Decrease in appetite 04/15/2020   Knee pain, left 06/17/2014   Pneumonia    Seasonal allergies     Social History   Socioeconomic History   Marital status: Single    Spouse name: Not on file   Number of children: Not on file   Years of education: Not on file   Highest education level: Not on file  Occupational History   Not on file  Tobacco Use   Smoking status: Never   Smokeless tobacco: Never  Vaping Use   Vaping status: Never Used  Substance and Sexual Activity   Alcohol use: No    Alcohol/week: 0.0 standard drinks of alcohol   Drug use: Never   Sexual activity: Not on file  Other Topics Concern   Not on file  Social History Narrative   Going into 8th grade.   Enjoys playing soccer and plays on a team.   Favorite food is pasta, pizza, fruit.   Favorite color is black.   2 sisters and 1 brother    Social Drivers of Corporate investment banker Strain: Not on file  Food Insecurity: Not on file  Transportation Needs: Not on file  Physical Activity: Not on file  Stress: Not on file  Social Connections: Unknown (05/17/2021)   [facility]   Social Network    Social Network: Not on file  Intimate Partner Violence: Unknown (04/08/2021)   Received from [facility]   HITS    Physically Hurt: Not on file    Insult or Talk Down To: Not on file    Threaten Physical Harm: Not on file    Scream or Curse: Not on file    No past surgical history on file.  Family History  Adopted: Yes    No Known Allergies  Current Outpatient Medications on File Prior to Visit  Medication Sig Dispense Refill   azelastine (ASTELIN) 0.1 % nasal spray 1-2 puff in each nostril Nasally Twice a day; Duration: 30 days     EPINEPHrine 0.3 mg/0.3 mL IJ SOAJ injection as directed Injection prn systemic reaction; Duration: 30 days     fluticasone (FLONASE) 50 MCG/ACT nasal spray 1 spray in each nostril Nasally Once a day; Duration: 30 days  levocetirizine (XYZAL) 5 MG tablet Take 5 mg by mouth every evening.     No current facility-administered medications on file prior to visit.    BP 118/68   Pulse 69   Temp 98.6 F (37 C) (Temporal)   Ht 5' 4 (1.626 m)   Wt 145 lb (65.8 kg)   SpO2 100%   BMI 24.89 kg/m  Objective:   Physical Exam Cardiovascular:     Rate and Rhythm: Normal rate and regular rhythm.     Comments: Varicose veins noted to left calf, left medial lower extremity and right lateral lower extremity  Pulmonary:     Effort: Pulmonary effort is normal.  Skin:    General: Skin is warm and dry.           Assessment & Plan:  Asymptomatic varicose veins of left lower extremity Assessment & Plan: Overall mild case.  We discussed conservative treatment such as compression socks/hose. Referral placed to vein and vascular per patient request.  Orders: -      Ambulatory referral to Vascular Surgery        Comer MARLA Gaskins, NP

## 2023-07-04 NOTE — Assessment & Plan Note (Signed)
 Overall mild case.  We discussed conservative treatment such as compression socks/hose. Referral placed to vein and vascular per patient request.

## 2023-07-05 DIAGNOSIS — J301 Allergic rhinitis due to pollen: Secondary | ICD-10-CM | POA: Diagnosis not present

## 2023-07-05 DIAGNOSIS — J3081 Allergic rhinitis due to animal (cat) (dog) hair and dander: Secondary | ICD-10-CM | POA: Diagnosis not present

## 2023-07-05 DIAGNOSIS — J3089 Other allergic rhinitis: Secondary | ICD-10-CM | POA: Diagnosis not present

## 2023-07-10 DIAGNOSIS — M545 Low back pain, unspecified: Secondary | ICD-10-CM | POA: Diagnosis not present

## 2023-07-10 DIAGNOSIS — M542 Cervicalgia: Secondary | ICD-10-CM | POA: Diagnosis not present

## 2023-07-10 DIAGNOSIS — M25551 Pain in right hip: Secondary | ICD-10-CM | POA: Diagnosis not present

## 2023-07-17 DIAGNOSIS — M25531 Pain in right wrist: Secondary | ICD-10-CM | POA: Diagnosis not present

## 2023-07-18 DIAGNOSIS — F4320 Adjustment disorder, unspecified: Secondary | ICD-10-CM | POA: Diagnosis not present

## 2023-07-24 DIAGNOSIS — M545 Low back pain, unspecified: Secondary | ICD-10-CM | POA: Diagnosis not present

## 2023-07-24 DIAGNOSIS — M25551 Pain in right hip: Secondary | ICD-10-CM | POA: Diagnosis not present

## 2023-07-24 DIAGNOSIS — M542 Cervicalgia: Secondary | ICD-10-CM | POA: Diagnosis not present

## 2023-07-26 DIAGNOSIS — F4320 Adjustment disorder, unspecified: Secondary | ICD-10-CM | POA: Diagnosis not present

## 2023-08-07 DIAGNOSIS — M25531 Pain in right wrist: Secondary | ICD-10-CM | POA: Diagnosis not present

## 2023-08-09 DIAGNOSIS — F4320 Adjustment disorder, unspecified: Secondary | ICD-10-CM | POA: Diagnosis not present

## 2023-08-14 DIAGNOSIS — M545 Low back pain, unspecified: Secondary | ICD-10-CM | POA: Diagnosis not present

## 2023-08-14 DIAGNOSIS — M25551 Pain in right hip: Secondary | ICD-10-CM | POA: Diagnosis not present

## 2023-08-14 DIAGNOSIS — M542 Cervicalgia: Secondary | ICD-10-CM | POA: Diagnosis not present

## 2023-09-04 DIAGNOSIS — M25551 Pain in right hip: Secondary | ICD-10-CM | POA: Diagnosis not present

## 2023-09-04 DIAGNOSIS — M542 Cervicalgia: Secondary | ICD-10-CM | POA: Diagnosis not present

## 2023-09-04 DIAGNOSIS — M545 Low back pain, unspecified: Secondary | ICD-10-CM | POA: Diagnosis not present

## 2023-09-06 ENCOUNTER — Other Ambulatory Visit: Payer: Self-pay | Admitting: Vascular Surgery

## 2023-09-06 DIAGNOSIS — F4320 Adjustment disorder, unspecified: Secondary | ICD-10-CM | POA: Diagnosis not present

## 2023-09-06 DIAGNOSIS — M7989 Other specified soft tissue disorders: Secondary | ICD-10-CM

## 2023-09-11 DIAGNOSIS — M545 Low back pain, unspecified: Secondary | ICD-10-CM | POA: Diagnosis not present

## 2023-09-11 DIAGNOSIS — M542 Cervicalgia: Secondary | ICD-10-CM | POA: Diagnosis not present

## 2023-09-11 DIAGNOSIS — M25551 Pain in right hip: Secondary | ICD-10-CM | POA: Diagnosis not present

## 2023-10-02 DIAGNOSIS — M545 Low back pain, unspecified: Secondary | ICD-10-CM | POA: Diagnosis not present

## 2023-10-02 DIAGNOSIS — M25551 Pain in right hip: Secondary | ICD-10-CM | POA: Diagnosis not present

## 2023-10-02 DIAGNOSIS — M542 Cervicalgia: Secondary | ICD-10-CM | POA: Diagnosis not present

## 2023-10-03 NOTE — Progress Notes (Unsigned)
 VASCULAR & VEIN SPECIALISTS           OF [place]  History and Physical   Jessica Roth is a 23 y.o. female who presents with varicose veins.   She was seen by her PCP for referral to vascular service for varicose veins.  She works several jobs, which require long periods of being on her feet and she plays competitive soccer in a couple of adult leagues.  She is not really bothered by her legs.  She doesn't notice much swelling or achiness.  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  She has never had a DVT.  She does not have any skin color changes.  She does not know her family hx as she is adopted.    She has worn compression sleeves in the past but did not really notice any difference in her legs.   She states that her aunt is a Engineer, civil (consulting) and encouraged her to be evaluated.    The pt is not on a statin for cholesterol management.  The pt is not on a daily aspirin.   Other AC:  none The pt is not on medication for hypertension.   The pt is not on medication for diabetes.   Tobacco hx:  never    Past Medical History:  Diagnosis Date   Acute foot pain, left 08/18/2021   Acute pain of right foot 06/24/2019   Decrease in appetite 04/15/2020   Knee pain, left 06/17/2014   Pneumonia    Seasonal allergies     PSH:  removal of benign tumor from left knee  Social History   Socioeconomic History   Marital status: Single    Spouse name: Not on file   Number of children: Not on file   Years of education: Not on file   Highest education level: Not on file  Occupational History   Not on file  Tobacco Use   Smoking status: Never   Smokeless tobacco: Never  Vaping Use   Vaping status: Never Used  Substance and Sexual Activity   Alcohol use: No    Alcohol/week: 0.0 standard drinks of alcohol   Drug use: Never   Sexual activity: Not on file  Other Topics Concern    Not on file  Social History Narrative   Going into 8th grade.   Enjoys playing soccer and plays on a team.   Favorite food is pasta, pizza, fruit.   Favorite color is black.   2 sisters and 1 brother   Social Drivers of Corporate investment banker Strain: Not on file  Food Insecurity: Not on file  Transportation Needs: Not on file  Physical Activity: Not on file  Stress: Not on file  Social Connections: Unknown (05/17/2021)   Received from [facility]   Social Network    Social Network: Not on file  Intimate Partner Violence: Unknown (04/08/2021)   Received from [facility]   HITS    Physically Hurt: Not on file    Insult or Talk Down To: Not on file    Threaten Physical Harm: Not on file    Scream or Curse: Not on file     Family History  Adopted: Yes    Current Outpatient Medications  Medication Sig Dispense Refill   azelastine (ASTELIN) 0.1 % nasal spray 1-2 puff  in each nostril Nasally Twice a day; Duration: 30 days     EPINEPHrine 0.3 mg/0.3 mL IJ SOAJ injection as directed Injection prn systemic reaction; Duration: 30 days     fluticasone (FLONASE) 50 MCG/ACT nasal spray 1 spray in each nostril Nasally Once a day; Duration: 30 days     levocetirizine (XYZAL) 5 MG tablet Take 5 mg by mouth every evening.     No current facility-administered medications for this visit.    No Known Allergies  REVIEW OF SYSTEMS:   [X]  denotes positive finding, [ ]  denotes negative finding Cardiac  Comments:  Chest pain or chest pressure:    Shortness of breath upon exertion:    Short of breath when lying flat:    Irregular heart rhythm:        Vascular    Pain in calf, thigh, or hip brought on by ambulation:    Pain in feet at night that wakes you up from your sleep:     Blood clot in your veins:    Leg swelling:         Pulmonary    Oxygen at home:    Productive cough:     Wheezing:         Neurologic    Sudden weakness in arms or legs:     Sudden numbness in  arms or legs:     Sudden onset of difficulty speaking or slurred speech:    Temporary loss of vision in one eye:     Problems with dizziness:         Gastrointestinal    Blood in stool:     Vomited blood:         Genitourinary    Burning when urinating:     Blood in urine:        Psychiatric    Major depression:         Hematologic    Bleeding problems:    Problems with blood clotting too easily:        Skin    Rashes or ulcers:        Constitutional    Fever or chills:      PHYSICAL EXAMINATION:  Today's Vitals   10/05/23 1205  BP: 122/86  Pulse: 75  Temp: 97.9 F (36.6 C)  SpO2: 98%  Weight: 143 lb (64.9 kg)  Height: 5' 4 (1.626 m)   Body mass index is 24.55 kg/m.   General:  WDWN in NAD; vital signs documented above Gait: Not observed HENT: WNL, normocephalic Pulmonary: normal non-labored breathing without wheezing Cardiac: regular HR Skin: without rashes Vascular Exam/Pulses:  Right Left  Radial 2+ (normal) 2+ (normal)  PT 2+ (normal) 2+ (normal)   Extremities: small varicosity mid medial calf.  No appreciable swelling  Neurologic: A&O X 3;  moving all extremities equally Psychiatric:  The pt has Normal affect.   Non-Invasive Vascular Imaging:   Venous duplex on 10/05/2023: +--------------+---------+------+-----------+------------+--------+  LEFT         Reflux NoRefluxReflux TimeDiameter cmsComments                          Yes                                   +--------------+---------+------+-----------+------------+--------+  CFV  yes   >1 second                       +--------------+---------+------+-----------+------------+--------+  FV mid                  yes   >1 second                       +--------------+---------+------+-----------+------------+--------+  Popliteal              yes   >1 second                        +--------------+---------+------+-----------+------------+--------+  GSV at [facility]              yes    >500 ms      0.55              +--------------+---------+------+-----------+------------+--------+  GSV prox thigh                              0.57              +--------------+---------+------+-----------+------------+--------+  GSV mid thigh           yes    >500 ms      0.52              +--------------+---------+------+-----------+------------+--------+  GSV dist thigh          yes    >500 ms      0.54              +--------------+---------+------+-----------+------------+--------+  GSV at knee             yes    >500 ms      0.53              +--------------+---------+------+-----------+------------+--------+  GSV prox calf           yes    >500 ms      0.54              +--------------+---------+------+-----------+------------+--------+  GSV mid calf            yes    >500 ms      0.44              +--------------+---------+------+-----------+------------+--------+  SSV at [facility]    no                            0.43              +--------------+---------+------+-----------+------------+--------+  SSV prox calf no                            0.20              +--------------+---------+------+-----------+------------+--------+  SSV mid calf  no                            0.24              +--------------+---------+------+-----------+------------+--------+  AASVo        no                            0.20              +--------------+---------+------+-----------+------------+--------+  Summary:  Left:  - No evidence of deep vein thrombosis seen in the left lower extremity, from the common femoral through the popliteal veins.  - No evidence of superficial venous thrombosis in the left lower extremity.  - No evidence of superficial venous reflux seen in the left short saphenous vein.  - Venous reflux is noted in  the left common femoral vein.  - Venous reflux is noted in the left sapheno-femoral junction.  - Venous reflux is noted in the left greater saphenous vein in the thigh.  - Venous reflux is noted in the left greater saphenous vein in the calf.  - Venous reflux is noted in the left femoral vein.  - Venous reflux is noted in the left popliteal vein     Jessica Roth is a 23 y.o. female who presents with: varicose veins    -pt has easily palpable PT pedal pulses -in the left lower extremity, the pt does not have evidence of DVT.  Pt does have venous reflux in the deep CFV, FV and popliteal veins as well as the GSV at the SFJ down to mid calf measuring 0.44cm-0.54cm.  there was no reflux in the SSV. -discussed with pt about wearing knee high 15-20 mmHg compression stockings and pt was measured for these today.    -discussed the importance of leg elevation and how to elevate properly - pt is advised to elevate their legs and a diagram is given to them to demonstrate for pt to lay flat on their back with knees elevated and slightly bent with their feet higher than their knees, which puts their feet higher than their heart for 15 minutes per day.  -pt is advised to continue as much walking as possible and avoid sitting or standing for long periods of time.  She is very active and stays on the go. -discussed importance of  exercise and that water aerobics would also be beneficial. [facility]    -handout with recommendations given -pt will f/u as needed.  She does have significant venous reflux but is asymptomatic.  Discussed that we would not do any vein procedures while she is of child bearing age.  Discussed the above and she will call in the future if she has any changes and wants re-evaluation.   She expressed good understanding.    Lucie Apt, [facility] Ve[facility] [phone]  Clinic MD:  [place]

## 2023-10-05 ENCOUNTER — Ambulatory Visit (INDEPENDENT_AMBULATORY_CARE_PROVIDER_SITE_OTHER): Admitting: Physician Assistant

## 2023-10-05 ENCOUNTER — Ambulatory Visit (HOSPITAL_COMMUNITY)
Admission: RE | Admit: 2023-10-05 | Discharge: 2023-10-05 | Disposition: A | Discharge status: Home / Self Care | Source: Ambulatory Visit | Attending: Vascular Surgery | Admitting: Vascular Surgery

## 2023-10-05 ENCOUNTER — Encounter: Payer: Self-pay | Admitting: Physician Assistant

## 2023-10-05 VITALS — BP 122/86 | HR 75 | Temp 97.9°F | Ht 64.0 in | Wt 143.0 lb

## 2023-10-05 DIAGNOSIS — I8393 Asymptomatic varicose veins of bilateral lower extremities: Secondary | ICD-10-CM

## 2023-10-05 DIAGNOSIS — M7989 Other specified soft tissue disorders: Secondary | ICD-10-CM | POA: Diagnosis not present

## 2023-10-05 DIAGNOSIS — I8392 Asymptomatic varicose veins of left lower extremity: Secondary | ICD-10-CM

## 2023-10-16 ENCOUNTER — Ambulatory Visit: Payer: Self-pay

## 2023-10-16 DIAGNOSIS — M546 Pain in thoracic spine: Secondary | ICD-10-CM | POA: Diagnosis not present

## 2023-10-16 DIAGNOSIS — M542 Cervicalgia: Secondary | ICD-10-CM | POA: Diagnosis not present

## 2023-10-16 DIAGNOSIS — M545 Low back pain, unspecified: Secondary | ICD-10-CM | POA: Diagnosis not present

## 2023-10-16 DIAGNOSIS — R0602 Shortness of breath: Secondary | ICD-10-CM | POA: Diagnosis not present

## 2023-10-16 NOTE — Telephone Encounter (Signed)
 FYI Only or Action Required?: FYI only for provider.  Patient was last seen in primary care on 07/04/2023 by Gretta Comer POUR, NP.  Called Nurse Triage reporting Shortness of Breath.  Symptoms began several days ago.  Interventions attempted: Rest, hydration, or home remedies.  Symptoms are: resolve with rest.  Triage Disposition: see physician within 4 hours appointment scheduled for 10/18/2023  Patient/caregiver understands and will follow disposition?: no  Copied from CRM #[id number]. Topic: Clinical - Red Word Triage >> Oct 16, 2023 10:06 AM Harlene ORN wrote: Red Word that prompted transfer to Nurse Triage:  tightness in her chest and back started a couple days ago. Starting to have difficulty breathing. Reason for Disposition  [1] MILD difficulty breathing (e.g., minimal/no SOB at rest, SOB with walking, pulse < 100) AND [2] NEW-onset or WORSE than normal    Difficulty breathing with strenuous activity  Additional Information  Negative: [1] MODERATE longstanding difficulty breathing (e.g., speaks in phrases, SOB even at rest, pulse 100-120) AND [2] SAME as normal    New SOB with strenuous activity.  Answer Assessment - Initial Assessment Questions Active person who has been experiencing chest tightness, mainly with activity over the past two days when playing soccer and pickle ball.  Also reports feeling like she cannot get a deep breath in.  Will refrain from sports until seen by MD  1. RESPIRATORY STATUS: Describe your breathing? (e.g., wheezing, shortness of breath, unable to speak, severe coughing)      At time of call, no difficulty breathing 2. ONSET: When did this breathing problem begin?      Two days ago 3. PATTERN Does the difficult breathing come and go, or has it been constant since it started?      Most often with activity 4. SEVERITY: How bad is your breathing? (e.g., mild, moderate, severe)      moderate 5. RECURRENT SYMPTOM: Have you had difficulty  breathing before? If Yes, ask: When was the last time? and What happened that time?      No incidence in the past 6. CARDIAC HISTORY: Do you have any history of heart disease? (e.g., heart attack, angina, bypass surgery, angioplasty)      denies 7. LUNG HISTORY: Do you have any history of lung disease?  (e.g., pulmonary embolus, asthma, emphysema)     denies 8. CAUSE: What do you think is causing the breathing problem?      unknown 9. OTHER SYMPTOMS: Do you have any other symptoms? (e.g., chest pain, cough, dizziness, fever, runny nose)     Slight runny nose, bad allergies 10. O2 SATURATION MONITOR:  Do you use an oxygen saturation monitor (pulse oximeter) at home? If Yes, ask: What is your reading (oxygen level) today? What is your usual oxygen saturation reading? (e.g., 95%)       Does not own 11. PREGNANCY: Is there any chance you are pregnant? When was your last menstrual period?       denies 73. TRAVEL: Have you traveled out of the country in the last month? (e.g., travel history, exposures)       denies  Protocols used: Breathing Difficulty-A-AH

## 2023-10-16 NOTE — Telephone Encounter (Signed)
 Noted and appreciate Dr. Daphine Deutscher evaluation.

## 2023-10-16 NOTE — Telephone Encounter (Signed)
 Appointment with Dr. Avelina on 10/18/23.

## 2023-10-18 ENCOUNTER — Ambulatory Visit: Admitting: Family Medicine

## 2023-10-18 DIAGNOSIS — M5382 Other specified dorsopathies, cervical region: Secondary | ICD-10-CM | POA: Diagnosis not present

## 2023-10-18 DIAGNOSIS — M9902 Segmental and somatic dysfunction of thoracic region: Secondary | ICD-10-CM | POA: Diagnosis not present

## 2023-10-18 DIAGNOSIS — S233XXA Sprain of ligaments of thoracic spine, initial encounter: Secondary | ICD-10-CM | POA: Diagnosis not present

## 2023-10-18 DIAGNOSIS — S335XXA Sprain of ligaments of lumbar spine, initial encounter: Secondary | ICD-10-CM | POA: Diagnosis not present

## 2023-10-22 DIAGNOSIS — S233XXA Sprain of ligaments of thoracic spine, initial encounter: Secondary | ICD-10-CM | POA: Diagnosis not present

## 2023-10-22 DIAGNOSIS — S335XXA Sprain of ligaments of lumbar spine, initial encounter: Secondary | ICD-10-CM | POA: Diagnosis not present

## 2023-10-22 DIAGNOSIS — M5382 Other specified dorsopathies, cervical region: Secondary | ICD-10-CM | POA: Diagnosis not present

## 2023-10-22 DIAGNOSIS — M9902 Segmental and somatic dysfunction of thoracic region: Secondary | ICD-10-CM | POA: Diagnosis not present

## 2023-10-24 ENCOUNTER — Ambulatory Visit: Admitting: Primary Care

## 2023-10-24 ENCOUNTER — Encounter: Payer: Self-pay | Admitting: Primary Care

## 2023-10-24 VITALS — BP 126/84 | HR 83 | Temp 97.5°F | Ht 64.0 in | Wt 143.0 lb

## 2023-10-24 DIAGNOSIS — R0789 Other chest pain: Secondary | ICD-10-CM

## 2023-10-24 DIAGNOSIS — R0609 Other forms of dyspnea: Secondary | ICD-10-CM | POA: Diagnosis not present

## 2023-10-24 LAB — BASIC METABOLIC PANEL WITH GFR
BUN: 16 mg/dL (ref 6–23)
CO2: 27 meq/L (ref 19–32)
Calcium: 9.1 mg/dL (ref 8.4–10.5)
Chloride: 103 meq/L (ref 96–112)
Creatinine, Ser: 0.82 mg/dL (ref 0.40–1.20)
GFR: 101.58 mL/min (ref >60.00)
Glucose, Bld: 89 mg/dL (ref 70–99)
Potassium: 4 meq/L (ref 3.5–5.1)
Sodium: 138 meq/L (ref 135–145)

## 2023-10-24 LAB — CBC
HCT: 39.2 % (ref 36.0–46.0)
Hemoglobin: 12.8 g/dL (ref 12.0–15.0)
MCHC: 32.8 g/dL (ref 30.0–36.0)
MCV: 88.6 fl (ref 78.0–100.0)
Platelets: 206 K/uL (ref 150.0–400.0)
RBC: 4.42 Mil/uL (ref 3.87–5.11)
RDW: 13.7 % (ref 11.5–15.5)
WBC: 4.9 K/uL (ref 4.0–10.5)

## 2023-10-24 LAB — TSH: TSH: 1.36 u[IU]/mL (ref 0.35–5.50)

## 2023-10-24 LAB — BRAIN NATRIURETIC PEPTIDE: Pro B Natriuretic peptide (BNP): 23 pg/mL (ref 0.0–100.0)

## 2023-10-24 NOTE — Assessment & Plan Note (Addendum)
 Obtaining labs to rule out metabolic cause and PE.  Reviewed chest x-ray from care everywhere which was without abnormality except for mild levoscoliosis. Reviewed EKG  She will contact her chiropractor to learn about the diagnosis for the bone density scan.

## 2023-10-24 NOTE — Progress Notes (Signed)
 Subjective:    Patient ID: Jessica Roth, female    DOB: 16-Apr-2000, 23 y.o.   MRN: [id number]  Jessica Roth is a very pleasant 23 y.o. female with a history of chronic neck pain who presents today to discuss chest pain.  Evaluated urgent care on 10/16/2023 for a 4-day history of shortness of breath and sensation of inability to take a deep breath.  She initially noticed her symptoms on 10/13/2023 while at a wedding, felt cold and tense. 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  She has been playing soccer and pickleball for the last 1-1/2 years.    During her urgent care visit she underwent EKG which was negative for acute process.  Chest x-ray was negative.  She was provided with Decadron intramuscularly and prescribed a prednisone  pack.  She was diagnosed with pleuritis.  Since her urgent care visit she has completed her prednisone  pack and has not noticed improvement in her symptoms.   She's been seeing a chiropractor for adjustments who recently completed a chest xray. She was told that she has mothing to her ribs and that they appeared unusual.  She was told that she needed a bone density scan..  Symptoms now include dyspnea and trouble taking in deep breaths when playing sports like pickleball and soccer. She has also noticed mid chest pain with applied  pressure and laying on her stomach. She has noticed chest pain with deep inspiration.   She denies wheezing, congestion, chest pain when leaning forward. She has noticed nasal congestion with the allergy shot. She has not had her allergy shots in 2 months.   Her symptoms are relieved with resting.  She denies a history of asthma.   Review of Systems  Constitutional:  Negative for chills and fever.  HENT:  Positive for congestion.   Respiratory:  Positive for chest tightness and shortness of breath. Negative for cough and wheezing.   Cardiovascular:  Negative for palpitations.  Genitourinary:   Negative for menstrual problem.  Allergic/Immunologic: Positive for environmental allergies.  Neurological:  Negative for dizziness and headaches.         Past Medical History:  Diagnosis Date   Acute foot pain, left 08/18/2021   Acute pain of right foot 06/24/2019   Decrease in appetite 04/15/2020   Knee pain, left 06/17/2014   Pneumonia    Seasonal allergies     Social History   Socioeconomic History   Marital status: Single    Spouse name: Not on file   Number of children: Not on file   Years of education: Not on file   Highest education level: Not on file  Occupational History   Not on file  Tobacco Use   Smoking status: Never   Smokeless tobacco: Never  Vaping Use   Vaping status: Never Used  Substance and Sexual Activity   Alcohol use: No    Alcohol/week: 0.0 standard drinks of alcohol   Drug use: Never   Sexual activity: Not on file  Other Topics Concern   Not on file  Social History Narrative   Going into 8th grade.   Enjoys playing soccer and plays on a team.   Favorite food is pasta, pizza, fruit.   Favorite color is black.   2 sisters and 1 brother   Social Drivers of Corporate investment banker Strain: Not on file  Food Insecurity: Not on file  Transportation Needs: Not on file  Physical Activity: Not on file  Stress: Not on file  Social Connections: Unknown (05/17/2021)   [facility]   Social Network    Social Network: Not on file  Intimate Partner Violence: Unknown (04/08/2021)   Received from [facility]   HITS    Physically Hurt: Not on file    Insult or Talk Down To: Not on file    Threaten Physical Harm: Not on file    Scream or Curse: Not on file    History reviewed. No pertinent surgical history.  Family History  Adopted: Yes    No Known Allergies  Current Outpatient Medications on File Prior to Visit  Medication Sig Dispense Refill   azelastine (ASTELIN) 0.1 % nasal spray 1-2 puff in each nostril Nasally  Twice a day; Duration: 30 days     EPINEPHrine 0.3 mg/0.3 mL IJ SOAJ injection as directed Injection prn systemic reaction; Duration: 30 days     fluticasone (FLONASE) 50 MCG/ACT nasal spray 1 spray in each nostril Nasally Once a day; Duration: 30 days     levocetirizine (XYZAL) 5 MG tablet Take 5 mg by mouth every evening.     No current facility-administered medications on file prior to visit.    BP 126/84   Pulse 83   Temp (!) 97.5 F (36.4 C) (Temporal)   Ht 5' 4 (1.626 m)   Wt 143 lb (64.9 kg)   LMP 10/03/2023   SpO2 99%   BMI 24.55 kg/m  Objective:   Physical Exam Cardiovascular:     Rate and Rhythm: Normal rate and regular rhythm.  Pulmonary:     Effort: Pulmonary effort is normal.     Breath sounds: Normal breath sounds.  Musculoskeletal:     Cervical back: Neck supple.  Skin:    General: Skin is warm and dry.  Neurological:     Mental Status: She is alert and oriented to person, place, and time.  Psychiatric:        Mood and Affect: Mood normal.     Physical Exam        Assessment & Plan:  Exertional dyspnea Assessment & Plan: Unclear etiology.  Differentials include undiagnosed asthma, valve disease, metabolic abnormality, PE.  Labs pending today including D-dimer, CBC, TSH, BNP, BMP. Reviewed urgent care notes and chest x-ray from care everywhere.  Consider pulmonology referral. Await results.  Orders: -     Brain natriuretic peptide -     D-dimer, quantitative -     Basic metabolic panel with GFR -     CBC -     TSH  Chest wall pain Assessment & Plan: Obtaining labs to rule out metabolic cause and PE.  Reviewed chest x-ray from care everywhere which was without abnormality except for mild levoscoliosis. Reviewed EKG  She will contact her chiropractor to learn about the diagnosis for the bone density scan.       Assessment and Plan Assessment & Plan         Jessica MARLA Gaskins, NP   History of Present Illness

## 2023-10-24 NOTE — Patient Instructions (Signed)
 Stop by the lab prior to leaving today. I will notify you of your results once received.   Please check on a diagnosis for the bone density scan.  It was a pleasure to see you today!

## 2023-10-24 NOTE — Assessment & Plan Note (Signed)
 Unclear etiology.  Differentials include undiagnosed asthma, valve disease, metabolic abnormality, PE.  Labs pending today including D-dimer, CBC, TSH, BNP, BMP. Reviewed urgent care notes and chest x-ray from care everywhere.  Consider pulmonology referral. Await results.

## 2023-10-25 ENCOUNTER — Ambulatory Visit: Payer: Self-pay | Admitting: Primary Care

## 2023-10-25 DIAGNOSIS — Q766 Other congenital malformations of ribs: Secondary | ICD-10-CM

## 2023-10-25 LAB — D-DIMER, QUANTITATIVE: D-Dimer, Quant: 0.28 ug{FEU}/mL (ref <0.50)

## 2023-10-30 DIAGNOSIS — M5382 Other specified dorsopathies, cervical region: Secondary | ICD-10-CM | POA: Diagnosis not present

## 2023-10-30 DIAGNOSIS — S233XXA Sprain of ligaments of thoracic spine, initial encounter: Secondary | ICD-10-CM | POA: Diagnosis not present

## 2023-10-30 DIAGNOSIS — M545 Low back pain, unspecified: Secondary | ICD-10-CM | POA: Diagnosis not present

## 2023-10-30 DIAGNOSIS — M542 Cervicalgia: Secondary | ICD-10-CM | POA: Diagnosis not present

## 2023-10-30 DIAGNOSIS — S335XXA Sprain of ligaments of lumbar spine, initial encounter: Secondary | ICD-10-CM | POA: Diagnosis not present

## 2023-10-30 DIAGNOSIS — M9902 Segmental and somatic dysfunction of thoracic region: Secondary | ICD-10-CM | POA: Diagnosis not present

## 2023-10-30 DIAGNOSIS — M546 Pain in thoracic spine: Secondary | ICD-10-CM | POA: Diagnosis not present

## 2023-11-06 DIAGNOSIS — M9902 Segmental and somatic dysfunction of thoracic region: Secondary | ICD-10-CM | POA: Diagnosis not present

## 2023-11-06 DIAGNOSIS — J45990 Exercise induced bronchospasm: Secondary | ICD-10-CM | POA: Diagnosis not present

## 2023-11-06 DIAGNOSIS — S233XXA Sprain of ligaments of thoracic spine, initial encounter: Secondary | ICD-10-CM | POA: Diagnosis not present

## 2023-11-06 DIAGNOSIS — J3089 Other allergic rhinitis: Secondary | ICD-10-CM | POA: Diagnosis not present

## 2023-11-06 DIAGNOSIS — S335XXA Sprain of ligaments of lumbar spine, initial encounter: Secondary | ICD-10-CM | POA: Diagnosis not present

## 2023-11-06 DIAGNOSIS — L2089 Other atopic dermatitis: Secondary | ICD-10-CM | POA: Diagnosis not present

## 2023-11-06 DIAGNOSIS — J301 Allergic rhinitis due to pollen: Secondary | ICD-10-CM | POA: Diagnosis not present

## 2023-11-06 DIAGNOSIS — M5382 Other specified dorsopathies, cervical region: Secondary | ICD-10-CM | POA: Diagnosis not present

## 2023-11-07 DIAGNOSIS — J301 Allergic rhinitis due to pollen: Secondary | ICD-10-CM | POA: Diagnosis not present

## 2023-11-07 DIAGNOSIS — J3089 Other allergic rhinitis: Secondary | ICD-10-CM | POA: Diagnosis not present

## 2023-11-07 DIAGNOSIS — J3081 Allergic rhinitis due to animal (cat) (dog) hair and dander: Secondary | ICD-10-CM | POA: Diagnosis not present

## 2023-11-08 ENCOUNTER — Telehealth: Payer: Self-pay | Admitting: Primary Care

## 2023-11-08 NOTE — Telephone Encounter (Signed)
 Placed notes and CD in Jessica Roth's in-box.

## 2023-11-08 NOTE — Telephone Encounter (Signed)
 Person dropped off in person her records from Chiropractic office for provider review

## 2023-11-13 DIAGNOSIS — M542 Cervicalgia: Secondary | ICD-10-CM | POA: Diagnosis not present

## 2023-11-13 DIAGNOSIS — M9902 Segmental and somatic dysfunction of thoracic region: Secondary | ICD-10-CM | POA: Diagnosis not present

## 2023-11-13 DIAGNOSIS — M5382 Other specified dorsopathies, cervical region: Secondary | ICD-10-CM | POA: Diagnosis not present

## 2023-11-13 DIAGNOSIS — S335XXA Sprain of ligaments of lumbar spine, initial encounter: Secondary | ICD-10-CM | POA: Diagnosis not present

## 2023-11-13 DIAGNOSIS — M546 Pain in thoracic spine: Secondary | ICD-10-CM | POA: Diagnosis not present

## 2023-11-13 DIAGNOSIS — S233XXA Sprain of ligaments of thoracic spine, initial encounter: Secondary | ICD-10-CM | POA: Diagnosis not present

## 2023-11-13 DIAGNOSIS — M545 Low back pain, unspecified: Secondary | ICD-10-CM | POA: Diagnosis not present

## 2023-11-14 NOTE — Telephone Encounter (Signed)
 Please thank her for the information. Not sure if she needed a copy of her paper records herself? Also, please return CD as we have no way of viewing. Placed in [name]'s inbox.

## 2023-11-14 NOTE — Telephone Encounter (Signed)
 Lvm asking pt to call back. Pls relay Kate's message and let pt know her CD is ready to pick up.   [Placed CD in box at front office.]

## 2023-11-15 NOTE — Telephone Encounter (Signed)
 Spoke with pt relaying Kate's message. Pt verbalizes understanding. States we can keep the ppw and scan into her chart. I notified the CD is available for her to pick up at the front desk. Pt expresses her thanks.

## 2023-11-20 DIAGNOSIS — S233XXA Sprain of ligaments of thoracic spine, initial encounter: Secondary | ICD-10-CM | POA: Diagnosis not present

## 2023-11-20 DIAGNOSIS — M5382 Other specified dorsopathies, cervical region: Secondary | ICD-10-CM | POA: Diagnosis not present

## 2023-11-20 DIAGNOSIS — S335XXA Sprain of ligaments of lumbar spine, initial encounter: Secondary | ICD-10-CM | POA: Diagnosis not present

## 2023-11-20 DIAGNOSIS — M9902 Segmental and somatic dysfunction of thoracic region: Secondary | ICD-10-CM | POA: Diagnosis not present

## 2023-11-26 DIAGNOSIS — M9902 Segmental and somatic dysfunction of thoracic region: Secondary | ICD-10-CM | POA: Diagnosis not present

## 2023-11-26 DIAGNOSIS — S335XXA Sprain of ligaments of lumbar spine, initial encounter: Secondary | ICD-10-CM | POA: Diagnosis not present

## 2023-11-26 DIAGNOSIS — M5382 Other specified dorsopathies, cervical region: Secondary | ICD-10-CM | POA: Diagnosis not present

## 2023-11-26 DIAGNOSIS — S233XXA Sprain of ligaments of thoracic spine, initial encounter: Secondary | ICD-10-CM | POA: Diagnosis not present

## 2023-12-04 DIAGNOSIS — S335XXA Sprain of ligaments of lumbar spine, initial encounter: Secondary | ICD-10-CM | POA: Diagnosis not present

## 2023-12-04 DIAGNOSIS — M5382 Other specified dorsopathies, cervical region: Secondary | ICD-10-CM | POA: Diagnosis not present

## 2023-12-04 DIAGNOSIS — S233XXA Sprain of ligaments of thoracic spine, initial encounter: Secondary | ICD-10-CM | POA: Diagnosis not present

## 2023-12-04 DIAGNOSIS — M9902 Segmental and somatic dysfunction of thoracic region: Secondary | ICD-10-CM | POA: Diagnosis not present

## 2024-01-11 NOTE — Telephone Encounter (Signed)
 Pt has had acute visits in past yr. Last OV was 10/19/21 CPE.   Printed form and NCIR, placed in [name]'s box until pt's appt.

## 2024-01-15 ENCOUNTER — Telehealth: Payer: Self-pay | Admitting: Primary Care

## 2024-01-15 NOTE — Telephone Encounter (Signed)
 Yes, nurse visit is fine.

## 2024-01-15 NOTE — Telephone Encounter (Signed)
 Copied from CRM #[id number]. Topic: Appointments - Scheduling Inquiry for Clinic >> Jan 15, 2024  9:47 AM China J wrote: Reason for CRM: The patient is needing an appointment to get her Meningococcal vaccination and TB testing done with [facility]. She also has a form for school that needs to be filled out at the time of the appointment. Patient is not wanting a physical visit type and is hoping to find something as soon as possible.  Please call the patient at [phone] for further updates.

## 2024-01-15 NOTE — Telephone Encounter (Signed)
 See patient's recent question.

## 2024-01-15 NOTE — Telephone Encounter (Signed)
 Please advise ok to give both TB screening and meningococcal Conjugate MCV at nurse visit.

## 2024-01-15 NOTE — Telephone Encounter (Signed)
 According pt chart, pt has only recently seen Mallie for acute visits. Last CPE- 10/19/21.   Spoke with pt pt asking if form requires any health info other than immunizations. Pt states, no and that form is for immunizations for college. Notified pt of info above and that I will check with Mallie first to see if OV is needed or just NV and will call back to let her know. Pt verbalizes understanding and expresses her thanks.

## 2024-01-16 NOTE — Telephone Encounter (Unsigned)
 Copied from CRM [id number]. Topic: Clinical - Request for Lab/Test Order >> Jan 16, 2024  1:01 PM Viola F wrote: Reason for CRM: Patient called to follow up on message from yesterday 01/15/24 - she scheduled nurse visits for 01/17/24 for the TB screening and meningococcal Conjugate MCV, needs order put in

## 2024-01-16 NOTE — Telephone Encounter (Signed)
 Unable to reach pt by phone and left v/m for pt to call [phone] for NV appt. I did speak with pts mom and she said to cancel appt on 01/17/24 and pt will call to schedule NV on a Tues to get TB skin test done and menveo.

## 2024-01-17 ENCOUNTER — Ambulatory Visit

## 2024-01-22 ENCOUNTER — Ambulatory Visit (INDEPENDENT_AMBULATORY_CARE_PROVIDER_SITE_OTHER)

## 2024-01-22 DIAGNOSIS — Z23 Encounter for immunization: Secondary | ICD-10-CM | POA: Diagnosis not present

## 2024-01-22 DIAGNOSIS — Z111 Encounter for screening for respiratory tuberculosis: Secondary | ICD-10-CM | POA: Diagnosis not present

## 2024-01-22 NOTE — Progress Notes (Signed)
 Per orders of Mallie Gaskins, DPN AGNP-C, injection of PPD and Menveo given by Javonda Suh Y Crecencio Kwiatek Patient tolerated injection well. Patient will make appointment for 2 day(s) for PPD reading. I have added PPD questions below. Patient has received VIS. NCIR has been updated and printed. Copy placed with from that will need to be given to patient when they return for PPD reading.     Tuberculin skin test applied to Left ventral forearm. Explained how to read the test, measuring induration not just erythema; she will come into office if test appears positive.  PPD Placement note Vernell Males, 24 y.o. female is here today for placement of PPD test Reason for PPD test: school  Pt taken PPD test before: yes Verified in allergy area and with patient that they are not allergic to the products PPD is made of (Phenol or Tween). Yes Is patient taking any oral or IV steroid medication now or have they taken it in the last month? no Has the patient ever received the BCG vaccine?: no Has the patient been in recent contact with anyone known or suspected of having active TB disease?: no      Date of exposure (if applicable):       Name of person they were exposed to (if applicable):  Patient's Country of origin?: [place]   O: Alert and oriented in NAD. P:  PPD placed on 01/22/2024.  Patient advised to return for reading within 48-72 hours.

## 2024-01-24 ENCOUNTER — Encounter: Admitting: Primary Care

## 2024-01-24 ENCOUNTER — Ambulatory Visit

## 2024-01-24 LAB — TB SKIN TEST
Induration: 0 mm
TB Skin Test: NEGATIVE

## 2024-01-24 NOTE — Progress Notes (Signed)
 PPD Reading Note PPD read and results entered in [employer]. Result: 0 mm induration. Interpretation: negitive If test not read within 48-72 hours of initial placement, patient advised to repeat in other arm 1-3 weeks after this test. Allergic reaction: no   Result entered on pt school form and form sent for scanning into pt c hart and original given to pt. Pt will ck with college to see if needs another menveo; per state registry pt menveo is complete. If pt needs another immunization pt will cb to schedule.
